# Patient Record
Sex: Female | Born: 1940 | Race: White | Hispanic: No | Marital: Married | Smoking: Never smoker
Health system: Southern US, Community
[De-identification: ages and names within clinical notes are randomized; demographics above are authoritative.]

## PROBLEM LIST (undated history)

## (undated) DIAGNOSIS — R42 Dizziness and giddiness: Secondary | ICD-10-CM

## (undated) DIAGNOSIS — M3500 Sicca syndrome, unspecified: Secondary | ICD-10-CM

## (undated) DIAGNOSIS — M130 Polyarthritis, unspecified: Secondary | ICD-10-CM

## (undated) DIAGNOSIS — N3281 Overactive bladder: Secondary | ICD-10-CM

## (undated) DIAGNOSIS — D472 Monoclonal gammopathy: Secondary | ICD-10-CM

## (undated) DIAGNOSIS — C8590 Non-Hodgkin lymphoma, unspecified, unspecified site: Secondary | ICD-10-CM

## (undated) HISTORY — PX: TUBAL LIGATION: SHX77

## (undated) HISTORY — DX: Polyarthritis, unspecified: M13.0

## (undated) HISTORY — DX: Dizziness and giddiness: R42

## (undated) HISTORY — PX: AUGMENTATION MAMMAPLASTY: SUR837

## (undated) HISTORY — DX: Non-Hodgkin lymphoma, unspecified, unspecified site: C85.90

## (undated) HISTORY — DX: Monoclonal gammopathy: D47.2

## (undated) HISTORY — PX: BREAST SURGERY: SHX581

## (undated) HISTORY — PX: BREAST BIOPSY: SHX20

## (undated) HISTORY — PX: MASTECTOMY: SHX3

## (undated) HISTORY — DX: Overactive bladder: N32.81

## (undated) HISTORY — DX: Sjogren syndrome, unspecified: M35.00

## (undated) HISTORY — PX: APPENDECTOMY: SHX54

---

## 1998-02-14 ENCOUNTER — Other Ambulatory Visit: Admission: RE | Admit: 1998-02-14 | Discharge: 1998-02-14 | Payer: Self-pay | Admitting: Obstetrics and Gynecology

## 1999-04-06 ENCOUNTER — Other Ambulatory Visit: Admission: RE | Admit: 1999-04-06 | Discharge: 1999-04-06 | Payer: Self-pay | Admitting: Obstetrics and Gynecology

## 1999-10-30 ENCOUNTER — Encounter: Payer: Self-pay | Admitting: Obstetrics and Gynecology

## 1999-10-30 ENCOUNTER — Encounter: Admission: RE | Admit: 1999-10-30 | Discharge: 1999-10-30 | Payer: Self-pay | Admitting: Obstetrics and Gynecology

## 2000-04-21 ENCOUNTER — Other Ambulatory Visit: Admission: RE | Admit: 2000-04-21 | Discharge: 2000-04-21 | Payer: Self-pay | Admitting: Obstetrics and Gynecology

## 2000-11-09 ENCOUNTER — Encounter: Admission: RE | Admit: 2000-11-09 | Discharge: 2000-11-09 | Payer: Self-pay | Admitting: Obstetrics and Gynecology

## 2000-11-09 ENCOUNTER — Encounter: Payer: Self-pay | Admitting: Obstetrics and Gynecology

## 2001-04-25 ENCOUNTER — Other Ambulatory Visit: Admission: RE | Admit: 2001-04-25 | Discharge: 2001-04-25 | Payer: Self-pay | Admitting: Obstetrics and Gynecology

## 2001-11-16 ENCOUNTER — Encounter: Admission: RE | Admit: 2001-11-16 | Discharge: 2001-11-16 | Payer: Self-pay | Admitting: Obstetrics and Gynecology

## 2001-11-16 ENCOUNTER — Encounter: Payer: Self-pay | Admitting: Obstetrics and Gynecology

## 2002-04-30 ENCOUNTER — Other Ambulatory Visit: Admission: RE | Admit: 2002-04-30 | Discharge: 2002-04-30 | Payer: Self-pay | Admitting: Obstetrics and Gynecology

## 2002-12-24 ENCOUNTER — Encounter: Payer: Self-pay | Admitting: Obstetrics and Gynecology

## 2002-12-24 ENCOUNTER — Ambulatory Visit (HOSPITAL_COMMUNITY): Admission: RE | Admit: 2002-12-24 | Discharge: 2002-12-24 | Payer: Self-pay | Admitting: Obstetrics and Gynecology

## 2003-05-23 ENCOUNTER — Other Ambulatory Visit: Admission: RE | Admit: 2003-05-23 | Discharge: 2003-05-23 | Payer: Self-pay | Admitting: Gynecology

## 2004-02-20 ENCOUNTER — Ambulatory Visit (HOSPITAL_COMMUNITY): Admission: RE | Admit: 2004-02-20 | Discharge: 2004-02-20 | Payer: Self-pay | Admitting: Obstetrics and Gynecology

## 2004-05-25 ENCOUNTER — Other Ambulatory Visit: Admission: RE | Admit: 2004-05-25 | Discharge: 2004-05-25 | Payer: Self-pay | Admitting: Obstetrics and Gynecology

## 2005-05-27 ENCOUNTER — Other Ambulatory Visit: Admission: RE | Admit: 2005-05-27 | Discharge: 2005-05-27 | Payer: Self-pay | Admitting: Obstetrics and Gynecology

## 2005-06-11 ENCOUNTER — Ambulatory Visit (HOSPITAL_COMMUNITY): Admission: RE | Admit: 2005-06-11 | Discharge: 2005-06-11 | Payer: Self-pay | Admitting: Obstetrics and Gynecology

## 2006-06-14 ENCOUNTER — Other Ambulatory Visit: Admission: RE | Admit: 2006-06-14 | Discharge: 2006-06-14 | Payer: Self-pay | Admitting: Obstetrics and Gynecology

## 2006-07-07 ENCOUNTER — Ambulatory Visit (HOSPITAL_COMMUNITY): Admission: RE | Admit: 2006-07-07 | Discharge: 2006-07-07 | Payer: Self-pay | Admitting: Obstetrics and Gynecology

## 2007-02-07 ENCOUNTER — Other Ambulatory Visit: Admission: RE | Admit: 2007-02-07 | Discharge: 2007-02-07 | Payer: Self-pay | Admitting: Obstetrics and Gynecology

## 2007-06-21 ENCOUNTER — Other Ambulatory Visit: Admission: RE | Admit: 2007-06-21 | Discharge: 2007-06-21 | Payer: Self-pay | Admitting: Obstetrics and Gynecology

## 2007-07-11 ENCOUNTER — Ambulatory Visit (HOSPITAL_COMMUNITY): Admission: RE | Admit: 2007-07-11 | Discharge: 2007-07-11 | Payer: Self-pay | Admitting: Obstetrics and Gynecology

## 2008-07-30 ENCOUNTER — Encounter: Payer: Self-pay | Admitting: Obstetrics and Gynecology

## 2008-07-30 ENCOUNTER — Ambulatory Visit: Payer: Self-pay | Admitting: Obstetrics and Gynecology

## 2008-07-30 ENCOUNTER — Other Ambulatory Visit: Admission: RE | Admit: 2008-07-30 | Discharge: 2008-07-30 | Payer: Self-pay | Admitting: Obstetrics and Gynecology

## 2008-08-28 ENCOUNTER — Ambulatory Visit (HOSPITAL_COMMUNITY): Admission: RE | Admit: 2008-08-28 | Discharge: 2008-08-28 | Payer: Self-pay | Admitting: Obstetrics and Gynecology

## 2009-07-31 ENCOUNTER — Ambulatory Visit: Payer: Self-pay | Admitting: Obstetrics and Gynecology

## 2009-09-19 ENCOUNTER — Ambulatory Visit (HOSPITAL_COMMUNITY): Admission: RE | Admit: 2009-09-19 | Discharge: 2009-09-19 | Payer: Self-pay | Admitting: Obstetrics and Gynecology

## 2009-10-18 ENCOUNTER — Encounter: Admission: RE | Admit: 2009-10-18 | Discharge: 2009-10-18 | Payer: Self-pay | Admitting: Otolaryngology

## 2010-09-02 ENCOUNTER — Ambulatory Visit: Admit: 2010-09-02 | Payer: Self-pay | Admitting: Obstetrics and Gynecology

## 2010-09-16 ENCOUNTER — Other Ambulatory Visit
Admission: RE | Admit: 2010-09-16 | Discharge: 2010-09-16 | Payer: Self-pay | Source: Home / Self Care | Admitting: Obstetrics and Gynecology

## 2010-09-16 ENCOUNTER — Ambulatory Visit
Admission: RE | Admit: 2010-09-16 | Discharge: 2010-09-16 | Payer: Self-pay | Source: Home / Self Care | Attending: Obstetrics and Gynecology | Admitting: Obstetrics and Gynecology

## 2010-09-16 ENCOUNTER — Other Ambulatory Visit: Payer: Self-pay | Admitting: Obstetrics and Gynecology

## 2010-09-21 ENCOUNTER — Ambulatory Visit (HOSPITAL_COMMUNITY)
Admission: RE | Admit: 2010-09-21 | Discharge: 2010-09-21 | Payer: Self-pay | Source: Home / Self Care | Attending: Obstetrics and Gynecology | Admitting: Obstetrics and Gynecology

## 2011-01-28 ENCOUNTER — Emergency Department (HOSPITAL_BASED_OUTPATIENT_CLINIC_OR_DEPARTMENT_OTHER)
Admission: EM | Admit: 2011-01-28 | Discharge: 2011-01-28 | Disposition: A | Discharge status: Other Acute Inpatient Hospital | Payer: Medicare Other | Attending: Emergency Medicine | Admitting: Emergency Medicine

## 2011-01-28 ENCOUNTER — Emergency Department (HOSPITAL_BASED_OUTPATIENT_CLINIC_OR_DEPARTMENT_OTHER): Payer: Medicare Other

## 2011-01-28 ENCOUNTER — Emergency Department (INDEPENDENT_AMBULATORY_CARE_PROVIDER_SITE_OTHER): Payer: Medicare Other

## 2011-01-28 DIAGNOSIS — S32509A Unspecified fracture of unspecified pubis, initial encounter for closed fracture: Secondary | ICD-10-CM | POA: Insufficient documentation

## 2011-01-28 DIAGNOSIS — Z8739 Personal history of other diseases of the musculoskeletal system and connective tissue: Secondary | ICD-10-CM | POA: Insufficient documentation

## 2011-01-28 DIAGNOSIS — M25559 Pain in unspecified hip: Secondary | ICD-10-CM

## 2011-01-28 DIAGNOSIS — Z79899 Other long term (current) drug therapy: Secondary | ICD-10-CM | POA: Insufficient documentation

## 2011-01-28 DIAGNOSIS — S32609A Unspecified fracture of unspecified ischium, initial encounter for closed fracture: Secondary | ICD-10-CM

## 2011-01-28 DIAGNOSIS — S322XXA Fracture of coccyx, initial encounter for closed fracture: Secondary | ICD-10-CM

## 2011-01-28 DIAGNOSIS — R42 Dizziness and giddiness: Secondary | ICD-10-CM | POA: Insufficient documentation

## 2011-01-28 DIAGNOSIS — W19XXXA Unspecified fall, initial encounter: Secondary | ICD-10-CM

## 2011-01-28 DIAGNOSIS — Y92009 Unspecified place in unspecified non-institutional (private) residence as the place of occurrence of the external cause: Secondary | ICD-10-CM | POA: Insufficient documentation

## 2011-01-28 DIAGNOSIS — Y93H2 Activity, gardening and landscaping: Secondary | ICD-10-CM | POA: Insufficient documentation

## 2011-01-28 DIAGNOSIS — S3210XA Unspecified fracture of sacrum, initial encounter for closed fracture: Secondary | ICD-10-CM | POA: Insufficient documentation

## 2011-01-28 DIAGNOSIS — W108XXA Fall (on) (from) other stairs and steps, initial encounter: Secondary | ICD-10-CM | POA: Insufficient documentation

## 2011-01-28 LAB — DIFFERENTIAL
Eosinophils Absolute: 0 10*3/uL (ref 0.0–0.7)
Lymphs Abs: 1.5 10*3/uL (ref 0.7–4.0)
Neutrophils Relative %: 80 % — ABNORMAL HIGH (ref 43–77)

## 2011-01-28 LAB — BASIC METABOLIC PANEL
CO2: 25 mEq/L (ref 19–32)
GFR calc non Af Amer: 55 mL/min — ABNORMAL LOW (ref >60)
Potassium: 4 mEq/L (ref 3.5–5.1)
Sodium: 136 mEq/L (ref 135–145)

## 2011-01-28 LAB — CBC
HCT: 33.4 % — ABNORMAL LOW (ref 36.0–46.0)
Hemoglobin: 11.2 g/dL — ABNORMAL LOW (ref 12.0–15.0)
MCH: 31 pg (ref 26.0–34.0)
MCHC: 33.5 g/dL (ref 30.0–36.0)
MCV: 92.5 fL (ref 78.0–100.0)
RBC: 3.61 MIL/uL — ABNORMAL LOW (ref 3.87–5.11)
RDW: 14.1 % (ref 11.5–15.5)
WBC: 13.2 10*3/uL — ABNORMAL HIGH (ref 4.0–10.5)

## 2011-09-09 ENCOUNTER — Other Ambulatory Visit: Payer: Self-pay | Admitting: Dermatology

## 2011-10-08 DIAGNOSIS — M130 Polyarthritis, unspecified: Secondary | ICD-10-CM | POA: Insufficient documentation

## 2011-10-08 DIAGNOSIS — M3500 Sicca syndrome, unspecified: Secondary | ICD-10-CM | POA: Insufficient documentation

## 2011-10-08 DIAGNOSIS — D472 Monoclonal gammopathy: Secondary | ICD-10-CM | POA: Insufficient documentation

## 2011-10-08 DIAGNOSIS — N3281 Overactive bladder: Secondary | ICD-10-CM | POA: Insufficient documentation

## 2011-10-13 ENCOUNTER — Other Ambulatory Visit: Payer: Self-pay | Admitting: Obstetrics and Gynecology

## 2011-10-13 DIAGNOSIS — Z1231 Encounter for screening mammogram for malignant neoplasm of breast: Secondary | ICD-10-CM

## 2011-10-18 ENCOUNTER — Ambulatory Visit (INDEPENDENT_AMBULATORY_CARE_PROVIDER_SITE_OTHER): Payer: Medicare Other | Admitting: Obstetrics and Gynecology

## 2011-10-18 ENCOUNTER — Encounter: Payer: Self-pay | Admitting: Obstetrics and Gynecology

## 2011-10-18 VITALS — BP 124/70 | Ht 63.5 in | Wt 109.0 lb

## 2011-10-18 DIAGNOSIS — M899 Disorder of bone, unspecified: Secondary | ICD-10-CM

## 2011-10-18 DIAGNOSIS — N760 Acute vaginitis: Secondary | ICD-10-CM

## 2011-10-18 DIAGNOSIS — M8580 Other specified disorders of bone density and structure, unspecified site: Secondary | ICD-10-CM

## 2011-10-18 DIAGNOSIS — R351 Nocturia: Secondary | ICD-10-CM

## 2011-10-18 DIAGNOSIS — R3915 Urgency of urination: Secondary | ICD-10-CM

## 2011-10-18 DIAGNOSIS — A499 Bacterial infection, unspecified: Secondary | ICD-10-CM

## 2011-10-18 DIAGNOSIS — N952 Postmenopausal atrophic vaginitis: Secondary | ICD-10-CM

## 2011-10-18 DIAGNOSIS — IMO0002 Reserved for concepts with insufficient information to code with codable children: Secondary | ICD-10-CM

## 2011-10-18 MED ORDER — FESOTERODINE FUMARATE ER 8 MG PO TB24: 8.0000 mg | ORAL_TABLET | Freq: Every day | ORAL | Status: DC

## 2011-10-18 NOTE — Progress Notes (Signed)
Patient came back to see me today for further followup. Since I last saw her she fell from a height and suffered 3 fractures in her pelvis. Only one was initially diagnosed. She actually had some bone displaced. There is no surgical correction for it. She is now doing physical therapy. In explaining the fall to me it clearly is a traumatic fracture. She takes calcium and vitamin D. She has osteopenia and is due for followup bone density. She is up-to-date on mammograms. She does have dyspareunia with vaginal dryness but feels she can tolerate it without vaginal estrogen. She is having no vaginal bleeding. She is having no pelvic pain. She is using oral refresh to keep her pH low in the vagina which has helped her with this vaginal discharge with odor. She also takes toviaz 8 mg with excellent results for urinary frequency and urgency. She is having no dysuria, hematuria, or incontinence.  ROS: 12 systems reviewed done. Pertinent positives above. Other positives include monoclonal gammopathy, sjogren"s disease and polyarthritis.  Physical examination:  Kennon Portela present. HEENT within normal limits. Neck: Thyroid not large. No masses. Supraclavicular nodes: not enlarged. Breasts: Examined in both sitting midline position. No skin changes and no masses. Abdomen: Soft no guarding rebound or masses or hernia. Pelvic: External: Within normal limits. BUS: Within normal limits. Vaginal:within normal limits. poor estrogen effect. No evidence of cystocele rectocele or enterocele. Cervix: clean. Uterus: Normal size and shape. Adnexa: No masses. Rectovaginal exam: Confirmatory and negative. Extremities: Within normal limits.  Assessment: #1. Persistent bacterial vaginosis #2. Osteopenia #3. Detrussor instability #4. Atrophic vaginitis  Plan: Continue Toviaz. Bone density ordered. Continue oral rephresh.

## 2011-11-04 ENCOUNTER — Ambulatory Visit (INDEPENDENT_AMBULATORY_CARE_PROVIDER_SITE_OTHER): Payer: Medicare Other

## 2011-11-04 DIAGNOSIS — M8580 Other specified disorders of bone density and structure, unspecified site: Secondary | ICD-10-CM

## 2011-11-04 DIAGNOSIS — M949 Disorder of cartilage, unspecified: Secondary | ICD-10-CM

## 2011-11-04 DIAGNOSIS — M899 Disorder of bone, unspecified: Secondary | ICD-10-CM

## 2011-11-05 ENCOUNTER — Ambulatory Visit (HOSPITAL_COMMUNITY)
Admission: RE | Admit: 2011-11-05 | Discharge: 2011-11-05 | Disposition: A | Discharge status: Home / Self Care | Payer: Medicare Other | Source: Ambulatory Visit | Attending: Obstetrics and Gynecology | Admitting: Obstetrics and Gynecology

## 2011-11-05 DIAGNOSIS — Z1231 Encounter for screening mammogram for malignant neoplasm of breast: Secondary | ICD-10-CM | POA: Insufficient documentation

## 2011-11-11 ENCOUNTER — Other Ambulatory Visit: Payer: Self-pay | Admitting: Dermatology

## 2012-02-01 ENCOUNTER — Other Ambulatory Visit: Payer: Self-pay | Admitting: Dermatology

## 2012-10-04 ENCOUNTER — Other Ambulatory Visit: Payer: Self-pay | Admitting: Obstetrics and Gynecology

## 2012-10-04 DIAGNOSIS — Z1231 Encounter for screening mammogram for malignant neoplasm of breast: Secondary | ICD-10-CM

## 2012-10-18 ENCOUNTER — Encounter: Payer: Self-pay | Admitting: Women's Health

## 2012-10-18 ENCOUNTER — Ambulatory Visit (INDEPENDENT_AMBULATORY_CARE_PROVIDER_SITE_OTHER): Payer: Medicare Other | Admitting: Women's Health

## 2012-10-18 VITALS — BP 116/74 | Ht 63.75 in | Wt 110.0 lb

## 2012-10-18 DIAGNOSIS — N3281 Overactive bladder: Secondary | ICD-10-CM

## 2012-10-18 DIAGNOSIS — N318 Other neuromuscular dysfunction of bladder: Secondary | ICD-10-CM

## 2012-10-18 MED ORDER — FESOTERODINE FUMARATE ER 8 MG PO TB24: 8.0000 mg | ORAL_TABLET | Freq: Every day | ORAL | Status: DC

## 2012-10-18 NOTE — Progress Notes (Signed)
Charlene Schroeder 1941-02-11 [id number]    History:    The patient presents for breast and pelvic exam.  Postmenopausal using over-the-counter profresh, which has helped prevent vaginal irritation/discharge. Osteopenia T score -1.6 at spine, -1.5 femeral neck FRAX 13%/1.8%  10/2011, Fosomax 01-09. Negative colonoscopy, due 2015. Bilateral mastectomies with silicone implants in the 80s for cysts, normal mammograms. Polyarthritis, Sjogren's, monoclonal gammopathy-Hematologist. Overactive bladder with good relief with Tovias. History of normal Paps. ASCUS 08 with -HR HPV.  Past medical history, past surgical history, family history and social history were all reviewed and documented in the EPIC chart. Father colon cancer, lymphoma, hypertension. History of a fractured  pelvis with traumatic fall. Two sons, 1 lives near [place], 1 lives here.   Exam:  Filed Vitals:   10/18/12 1124  BP: 116/74    General appearance:  Normal Head/Neck:  Normal, without cervical or supraclavicular adenopathy. Thyroid:  Symmetrical, normal in size, without palpable masses or nodularity. Respiratory  Effort:  Normal  Auscultation:  Clear without wheezing or rhonchi Cardiovascular  Auscultation:  Regular rate, without rubs, murmurs or gallops  Edema/varicosities:  Not grossly evident Abdominal  Soft,nontender, without masses, guarding or rebound.  Liver/spleen:  No organomegaly noted  Hernia:  None appreciated  Skin  Inspection:  Grossly normal  Palpation:  Grossly normal Neurologic/psychiatric  Orientation:  Normal with appropriate conversation.  Mood/affect:  Normal  Genitourinary    Breasts: Examined lying and sitting/silicone implants.     Right: Without masses, retractions, discharge or axillary adenopathy.     Left: Without masses, retractions, discharge or axillary adenopathy.   Inguinal/mons:  Normal without inguinal adenopathy  External genitalia:  Normal  BUS/Urethra/Skene's glands:   Normal  Bladder:  Normal  Vagina:  Normal  Cervix:  Normal  Uterus:   normal in size, shape and contour.  Midline and mobile  Adnexa/parametria:     Rt: Without masses or tenderness.   Lt: Without masses or tenderness.  Anus and perineum: Normal  Digital rectal exam: Normal sphincter tone without palpated masses or tenderness  Assessment/Plan:  72 y.o. MWF G2P2 for  Breast and pelvic exam.     Postmenopausal/no bleeding/no HRT using over-the-counter profresh Osteopenia T score -1.5 at left femoral hip 10/2011 Polyarthritis,Sjogren's-rheumatologist Monoclonal gammopathy hematologist Overactive bladder good relief with toviaz  Plan: SBE's, continue annual mammogram, calcium rich diet, vitamin D 2000 daily encouraged. Home safety, fall prevention and importance of exercise reviewed. Home Hemoccult card given with instructions. Toviaz 8 mg by mouth daily prescription, proper use given and reviewed. Paps normal 2012, new screening guidelines reviewed.    Harrington Challenger [facility], 11:57 AM 10/18/2012

## 2012-10-18 NOTE — Patient Instructions (Addendum)

## 2012-10-19 ENCOUNTER — Encounter: Payer: Self-pay | Admitting: Obstetrics and Gynecology

## 2012-10-30 ENCOUNTER — Other Ambulatory Visit: Payer: Self-pay | Admitting: Anesthesiology

## 2012-10-30 DIAGNOSIS — Z1211 Encounter for screening for malignant neoplasm of colon: Secondary | ICD-10-CM

## 2012-10-31 ENCOUNTER — Other Ambulatory Visit: Payer: Self-pay | Admitting: Gynecology

## 2012-10-31 DIAGNOSIS — Z1211 Encounter for screening for malignant neoplasm of colon: Secondary | ICD-10-CM

## 2012-11-06 ENCOUNTER — Ambulatory Visit (HOSPITAL_COMMUNITY): Payer: Medicare Other

## 2013-01-11 ENCOUNTER — Other Ambulatory Visit: Payer: Self-pay | Admitting: Dermatology

## 2013-06-27 ENCOUNTER — Other Ambulatory Visit: Payer: Self-pay | Admitting: Dermatology

## 2013-08-22 ENCOUNTER — Other Ambulatory Visit: Payer: Self-pay | Admitting: Women's Health

## 2013-08-22 DIAGNOSIS — Z1231 Encounter for screening mammogram for malignant neoplasm of breast: Secondary | ICD-10-CM

## 2013-08-31 ENCOUNTER — Other Ambulatory Visit: Payer: Self-pay | Admitting: Women's Health

## 2013-08-31 ENCOUNTER — Ambulatory Visit (HOSPITAL_COMMUNITY)
Admission: RE | Admit: 2013-08-31 | Discharge: 2013-08-31 | Disposition: A | Discharge status: Home / Self Care | Payer: Medicare Other | Source: Ambulatory Visit | Attending: Women's Health | Admitting: Women's Health

## 2013-08-31 DIAGNOSIS — Z1231 Encounter for screening mammogram for malignant neoplasm of breast: Secondary | ICD-10-CM

## 2013-12-10 ENCOUNTER — Encounter: Payer: Medicare Other | Admitting: Women's Health

## 2013-12-11 ENCOUNTER — Ambulatory Visit (INDEPENDENT_AMBULATORY_CARE_PROVIDER_SITE_OTHER): Payer: Medicare Other | Admitting: Women's Health

## 2013-12-11 ENCOUNTER — Encounter: Payer: Self-pay | Admitting: Women's Health

## 2013-12-11 ENCOUNTER — Telehealth: Payer: Self-pay | Admitting: *Deleted

## 2013-12-11 ENCOUNTER — Other Ambulatory Visit (HOSPITAL_COMMUNITY)
Admission: RE | Admit: 2013-12-11 | Discharge: 2013-12-11 | Disposition: A | Discharge status: Home / Self Care | Payer: Medicare Other | Source: Ambulatory Visit | Attending: Gynecology | Admitting: Gynecology

## 2013-12-11 ENCOUNTER — Other Ambulatory Visit: Payer: Self-pay | Admitting: Women's Health

## 2013-12-11 VITALS — BP 104/64 | Ht 63.5 in | Wt 108.0 lb

## 2013-12-11 DIAGNOSIS — M899 Disorder of bone, unspecified: Secondary | ICD-10-CM

## 2013-12-11 DIAGNOSIS — Z124 Encounter for screening for malignant neoplasm of cervix: Secondary | ICD-10-CM | POA: Insufficient documentation

## 2013-12-11 DIAGNOSIS — M8580 Other specified disorders of bone density and structure, unspecified site: Secondary | ICD-10-CM

## 2013-12-11 DIAGNOSIS — M949 Disorder of cartilage, unspecified: Secondary | ICD-10-CM

## 2013-12-11 DIAGNOSIS — N3281 Overactive bladder: Secondary | ICD-10-CM

## 2013-12-11 DIAGNOSIS — N318 Other neuromuscular dysfunction of bladder: Secondary | ICD-10-CM

## 2013-12-11 MED ORDER — FESOTERODINE FUMARATE ER 4 MG PO TB24: 4.0000 mg | ORAL_TABLET | Freq: Every day | ORAL | Status: DC

## 2013-12-11 MED ORDER — FESOTERODINE FUMARATE ER 8 MG PO TB24: 8.0000 mg | ORAL_TABLET | Freq: Every day | ORAL | Status: DC

## 2013-12-11 NOTE — Telephone Encounter (Signed)
Pharmacy called to clarify if patient should be on toviaz 4 mg or 8mg . Per note 12/11/13 pt will decrease to 4 mg. [facility] informed.

## 2013-12-11 NOTE — Addendum Note (Signed)
Addended by: Alen Blew on: 12/11/2013 04:17 PM   Modules accepted: Orders

## 2013-12-11 NOTE — Progress Notes (Signed)
Charlene Schroeder 1940-10-28 [id number]    History:    Presents for annual exam.  Postmenopausal using over-the-counter vaginal refresh, which has helped prevent vaginal irritation/discharge. 10/2011  T score -1.6 at spine, -1.5 femeral neck FRAX 13%/1.8%., Fosomax 2001-09. Negative colonoscopy, due this year. Bilateral mastectomies with silicone implants in the 80s for cysts, normal mammograms. Polyarthritis, Sjogren's, monoclonal gammopathy-Hematologist. Overactive bladder with good relief with Tovias. History of normal Paps with 2008 ASCUS  with -HR HPV.  Past medical history, past surgical history, family history and social history were all reviewed and documented in the EPIC chart. Father colon cancer, lymphoma, hypertension. History of a fractured pelvis with traumatic fall. Two sons, 1 lives near [place], 1 lives here.  ROS:  A  12 POINT ROS was performed and pertinent positives and negatives are included.  Exam:  Filed Vitals:   12/11/13 1151  BP: 104/64    General appearance:  Normal Thyroid:  Symmetrical, normal in size, without palpable masses or nodularity. Respiratory  Auscultation:  Clear without wheezing or rhonchi Cardiovascular  Auscultation:  Regular rate, without rubs, murmurs or gallops  Edema/varicosities:  Not grossly evident Abdominal  Soft,nontender, without masses, guarding or rebound.  Liver/spleen:  No organomegaly noted  Hernia:  None appreciated  Skin  Inspection:  Grossly normal   Breasts: Examined lying and sitting.     Right: Without masses, retractions, discharge or axillary adenopathy.     Left: Without masses, retractions, discharge or axillary adenopathy. Gentitourinary   Inguinal/mons:  Normal without inguinal adenopathy  External genitalia:  Normal  BUS/Urethra/Skene's glands:  Normal  Vagina:  atrophic  Cervix:  Normal  Uterus:   normal in size, shape and contour.  Midline and mobile  Adnexa/parametria:     Rt: Without masses or  tenderness.   Lt: Without masses or tenderness.  Anus and perineum: Normal  Digital rectal exam: Normal sphincter tone without palpated masses or tenderness  Assessment/Plan:  73 y.o. M WF G2P2 for annual exam.     Postmenopausal/no bleeding/no HRT/using over-the-counter Rephresh  Osteopenia fosamax holiday  Polyarthritis,Sjogren's-rheumatologist  Monoclonal gammopathy hematologist  Overactive bladder good relief with Lisbeth Ply  Plan: Toviaz, had been on 8 mg daily, will decrease dose to 4 mg, will call if problems with that change. Reviewed importance of adequate fluids. bladder. SBE's, continue annual mammogram, calcium rich diet, vitamin D 2000 daily encouraged. Bone density and colonoscopy scheduled. Home safety, fall prevention and importance of exercise reviewed. Pap. Continue to follow up with rheumatologist and hematologist.   [facility], 12:31 PM 12/11/2013

## 2013-12-11 NOTE — Patient Instructions (Signed)
Health Recommendations for Postmenopausal Women Respected and ongoing research has looked at the most common causes of death, disability, and poor quality of life in postmenopausal women. The causes include heart disease, diseases of blood vessels, diabetes, depression, cancer, and bone loss (osteoporosis). Many things can be done to help lower the chances of developing these and other common problems: CARDIOVASCULAR DISEASE Heart Disease: A heart attack is a medical emergency. Know the signs and symptoms of a heart attack. Below are things women can do to reduce their risk for heart disease.   Do not smoke. If you smoke, quit.  Aim for a healthy weight. Being overweight causes many preventable deaths. Eat a healthy and balanced diet and drink an adequate amount of liquids.  Get moving. Make a commitment to be more physically active. Aim for 30 minutes of activity on most, if not all days of the week.  Eat for heart health. Choose a diet that is low in saturated fat and cholesterol and eliminate trans fat. Include whole grains, vegetables, and fruits. Read and understand the labels on food containers before buying.  Know your numbers. Ask your caregiver to check your blood pressure, cholesterol (total, HDL, LDL, triglycerides) and blood glucose. Work with your caregiver on improving your entire clinical picture.  High blood pressure. Limit or stop your table salt intake (try salt substitute and food seasonings). Avoid salty foods and drinks. Read labels on food containers before buying. Eating well and exercising can help control high blood pressure. STROKE  Stroke is a medical emergency. Stroke may be the result of a blood clot in a blood vessel in the brain or by a brain hemorrhage (bleeding). Know the signs and symptoms of a stroke. To lower the risk of developing a stroke:  Avoid fatty foods.  Quit smoking.  Control your diabetes, blood pressure, and irregular heart rate. THROMBOPHLEBITIS  (BLOOD CLOT) OF THE LEG  Becoming overweight and leading a stationary lifestyle may also contribute to developing blood clots. Controlling your diet and exercising will help lower the risk of developing blood clots. CANCER SCREENING  Breast Cancer: Take steps to reduce your risk of breast cancer.  You should practice "breast self-awareness." This means understanding the normal appearance and feel of your breasts and should include breast self-examination. Any changes detected, no matter how small, should be reported to your caregiver.  After age 40, you should have a clinical breast exam (CBE) every year.  Starting at age 40, you should consider having a mammogram (breast X-ray) every year.  If you have a family history of breast cancer, talk to your caregiver about genetic screening.  If you are at high risk for breast cancer, talk to your caregiver about having an MRI and a mammogram every year.  Intestinal or Stomach Cancer: Tests to consider are a rectal exam, fecal occult blood, sigmoidoscopy, and colonoscopy. Women who are high risk may need to be screened at an earlier age and more often.  Cervical Cancer:  Beginning at age 30, you should have a Pap test every 3 years as long as the past 3 Pap tests have been normal.  If you have had past treatment for cervical cancer or a condition that could lead to cancer, you need Pap tests and screening for cancer for at least 20 years after your treatment.  If you had a hysterectomy for a problem that was not cancer or a condition that could lead to cancer, then you no longer need Pap tests.    If you are between ages 65 and 70, and you have had normal Pap tests going back 10 years, you no longer need Pap tests.  If Pap tests have been discontinued, risk factors (such as a new sexual partner) need to be reassessed to determine if screening should be resumed.  Some medical problems can increase the chance of getting cervical cancer. In these  cases, your caregiver may recommend more frequent screening and Pap tests.  Uterine Cancer: If you have vaginal bleeding after reaching menopause, you should notify your caregiver.  Ovarian cancer: Other than yearly pelvic exams, there are no reliable tests available to screen for ovarian cancer at this time except for yearly pelvic exams.  Lung Cancer: Yearly chest X-rays can detect lung cancer and should be done on high risk women, such as cigarette smokers and women with chronic lung disease (emphysema).  Skin Cancer: A complete body skin exam should be done at your yearly examination. Avoid overexposure to the sun and ultraviolet light lamps. Use a strong sun block cream when in the sun. All of these things are important in lowering the risk of skin cancer. MENOPAUSE Menopause Symptoms: Hormone therapy products are effective for treating symptoms associated with menopause:  Moderate to severe hot flashes.  Night sweats.  Mood swings.  Headaches.  Tiredness.  Loss of sex drive.  Insomnia.  Other symptoms. Hormone replacement carries certain risks, especially in older women. Women who use or are thinking about using estrogen or estrogen with progestin treatments should discuss that with their caregiver. Your caregiver will help you understand the benefits and risks. The ideal dose of hormone replacement therapy is not known. The Food and Drug Administration (FDA) has concluded that hormone therapy should be used only at the lowest doses and for the shortest amount of time to reach treatment goals.  OSTEOPOROSIS Protecting Against Bone Loss and Preventing Fracture: If you use hormone therapy for prevention of bone loss (osteoporosis), the risks for bone loss must outweigh the risk of the therapy. Ask your caregiver about other medications known to be safe and effective for preventing bone loss and fractures. To guard against bone loss or fractures, the following is recommended:  If  you are less than age 50, take 1000 mg of calcium and at least 600 mg of Vitamin D per day.  If you are greater than age 50 but less than age 70, take 1200 mg of calcium and at least 600 mg of Vitamin D per day.  If you are greater than age 70, take 1200 mg of calcium and at least 800 mg of Vitamin D per day. Smoking and excessive alcohol intake increases the risk of osteoporosis. Eat foods rich in calcium and vitamin D and do weight bearing exercises several times a week as your caregiver suggests. DIABETES Diabetes Melitus: If you have Type I or Type 2 diabetes, you should keep your blood sugar under control with diet, exercise and recommended medication. Avoid too many sweets, starchy and fatty foods. Being overweight can make control more difficult. COGNITION AND MEMORY Cognition and Memory: Menopausal hormone therapy is not recommended for the prevention of cognitive disorders such as Alzheimer's disease or memory loss.  DEPRESSION  Depression may occur at any age, but is common in elderly women. The reasons may be because of physical, medical, social (loneliness), or financial problems and needs. If you are experiencing depression because of medical problems and control of symptoms, talk to your caregiver about this. Physical activity and   exercise may help with mood and sleep. Community and volunteer involvement may help your sense of value and worth. If you have depression and you feel that the problem is getting worse or becoming severe, talk to your caregiver about treatment options that are best for you. ACCIDENTS  Accidents are common and can be serious in the elderly woman. Prepare your house to prevent accidents. Eliminate throw rugs, place hand bars in the bath, shower and toilet areas. Avoid wearing high heeled shoes or walking on wet, snowy, and icy areas. Limit or stop driving if you have vision or hearing problems, or you feel you are unsteady with you movements and  reflexes. HEPATITIS C Hepatitis C is a type of viral infection affecting the liver. It is spread mainly through contact with blood from an infected person. It can be treated, but if left untreated, it can lead to severe liver damage over years. Many people who are infected do not know that the virus is in their blood. If you are a "baby-boomer", it is recommended that you have one screening test for Hepatitis C. IMMUNIZATIONS  Several immunizations are important to consider having during your senior years, including:   Tetanus, diptheria, and pertussis booster shot.  Influenza every year before the flu season begins.  Pneumonia vaccine.  Shingles vaccine.  Others as indicated based on your specific needs. Talk to your caregiver about these. Document Released: 10/01/2005 Document Revised: 07/26/2012 Document Reviewed: 05/27/2008 [employer] Patient Information 2014 [employer].  

## 2013-12-19 ENCOUNTER — Other Ambulatory Visit: Payer: Self-pay | Admitting: Gynecology

## 2013-12-19 DIAGNOSIS — M8580 Other specified disorders of bone density and structure, unspecified site: Secondary | ICD-10-CM

## 2014-01-29 ENCOUNTER — Other Ambulatory Visit: Payer: Self-pay | Admitting: Dermatology

## 2014-04-17 ENCOUNTER — Telehealth: Payer: Self-pay | Admitting: *Deleted

## 2014-04-17 NOTE — Telephone Encounter (Signed)
Pt received letter stating insurance will no longer pay for toviaz, pt asked if another rx could be prescribed? Pt last seen on 12/11/13. Please advise

## 2014-04-17 NOTE — Telephone Encounter (Signed)
Message left

## 2014-04-18 ENCOUNTER — Other Ambulatory Visit: Payer: Self-pay | Admitting: Women's Health

## 2014-04-18 MED ORDER — TOLTERODINE TARTRATE ER 2 MG PO CP24: 2.0000 mg | ORAL_CAPSULE | Freq: Every day | ORAL | Status: DC

## 2014-04-18 NOTE — Telephone Encounter (Signed)
Telephone call, states Charlene Schroeder has worked great but not no longer covered by insurance. Would like to try something else, will try Detrol LA 2 mg, instructed to call if no relief of symptoms.

## 2014-05-13 ENCOUNTER — Telehealth: Payer: Self-pay | Admitting: *Deleted

## 2014-05-13 NOTE — Telephone Encounter (Signed)
Pt said the Detrol  LA 2 mg is not really helping her problem. Pt would like to have toviaz resubmitted to pharmacy so prior authorization could be ran. While this is being done pt will continue to take the detrol 2 mg until we fighure out of insurance will approve toviaz. Please advise

## 2014-05-13 NOTE — Telephone Encounter (Signed)
Could try the detrol 4mg , caution against dry mouth and constipation.

## 2014-05-14 MED ORDER — TOLTERODINE TARTRATE ER 4 MG PO CP24: 4.0000 mg | ORAL_CAPSULE | Freq: Every day | ORAL | Status: DC

## 2014-05-14 NOTE — Telephone Encounter (Signed)
Pt said she would like to try the below first she will follow up if the below is note working. Rx sent

## 2014-06-24 ENCOUNTER — Encounter: Payer: Self-pay | Admitting: Women's Health

## 2014-08-19 ENCOUNTER — Other Ambulatory Visit: Payer: Self-pay | Admitting: Women's Health

## 2014-08-19 DIAGNOSIS — Z1231 Encounter for screening mammogram for malignant neoplasm of breast: Secondary | ICD-10-CM

## 2014-10-02 ENCOUNTER — Ambulatory Visit (HOSPITAL_COMMUNITY)
Admission: RE | Admit: 2014-10-02 | Discharge: 2014-10-02 | Disposition: A | Discharge status: Home / Self Care | Payer: Medicare Other | Source: Ambulatory Visit | Attending: Women's Health | Admitting: Women's Health

## 2014-10-02 DIAGNOSIS — Z1231 Encounter for screening mammogram for malignant neoplasm of breast: Secondary | ICD-10-CM | POA: Diagnosis not present

## 2014-12-13 ENCOUNTER — Encounter: Payer: Self-pay | Admitting: Women's Health

## 2014-12-13 ENCOUNTER — Ambulatory Visit (INDEPENDENT_AMBULATORY_CARE_PROVIDER_SITE_OTHER): Payer: Medicare Other | Admitting: Women's Health

## 2014-12-13 VITALS — BP 128/80 | Ht 63.0 in | Wt 109.0 lb

## 2014-12-13 DIAGNOSIS — N3281 Overactive bladder: Secondary | ICD-10-CM

## 2014-12-13 DIAGNOSIS — N898 Other specified noninflammatory disorders of vagina: Secondary | ICD-10-CM | POA: Diagnosis not present

## 2014-12-13 LAB — WET PREP FOR TRICH, YEAST, CLUE
Clue Cells Wet Prep HPF POC: NONE SEEN
Trich, Wet Prep: NONE SEEN
Yeast Wet Prep HPF POC: NONE SEEN

## 2014-12-13 MED ORDER — OXYBUTYNIN CHLORIDE ER 5 MG PO TB24: 5.0000 mg | ORAL_TABLET | Freq: Every day | ORAL | Status: DC

## 2014-12-13 NOTE — Patient Instructions (Signed)
Overactive Bladder The bladder has two functions that are totally opposite of the other. One is to relax and stretch out so it can store urine (fills like a balloon), and the other is to contract and squeeze down so that it can empty the urine that it has stored. Proper functioning of the bladder is a complex mixing of these two functions. The filling and emptying of the bladder can be influenced by:  The bladder.  The spinal cord.  The brain.  The nerves going to the bladder.  Other organs that are closely related to the bladder such as prostate in males and the vagina in females. As your bladder fills with urine, nerve signals are sent from the bladder to the brain to tell you that you may need to urinate. Normal urination requires that the bladder squeeze down with sufficient strength to empty the bladder, but this also requires that the bladder squeeze down sufficiently long to finish the job. In addition the sphincter muscles, which normally keep you from leaking urine, must also relax so that the urine can pass. Coordination between the bladder muscle squeezing down and the sphincter muscles relaxing is required to make everything happen normally. With an overactive bladder sometimes the muscles of the bladder contract unexpectedly and involuntarily and this causes an urgent need to urinate. The normal response is to try to hold urine in by contracting the sphincter muscles. Sometimes the bladder contracts so strongly that the sphincter muscles cannot stop the urine from passing out and incontinence occurs. This kind of incontinence is called urge incontinence. Having an overactive bladder can be embarrassing and awkward. It can keep you from living life the way you want to. Many people think it is just something you have to put up with as you grow older or have certain health conditions. In fact, there are treatments that can help make your life easier and more pleasant. CAUSES  Many things  can cause an overactive bladder. Possibilities include:  Urinary tract infection or infection of nearby tissues such as the prostate.  Prostate enlargement.  In women, multiple pregnancies or surgery on the uterus or urethra.  Bladder stones, inflammation, or tumors.  Caffeine.  Alcohol.  Medications. For example, diuretics (drugs that help the body get rid of extra fluid) increase urine production. Some other medicines must be taken with lots of fluids.  Muscle or nerve weakness. This might be the result of a spinal cord injury, a stroke, multiple sclerosis, or Parkinson disease.  Diabetes can cause a high urine volume which fills the bladder so quickly that the normal urge to urinate is triggered very strongly. SYMPTOMS   Loss of bladder control. You feel the need to urinate and cannot make your body wait.  Sudden, strong urges to urinate.  Urinating 8 or more times a day.  Waking up to urinate two or more times a night. DIAGNOSIS  To decide if you have overactive bladder, your health care provider will probably:  Ask about symptoms you have noticed.  Ask about your overall health. This will include questions about any medications you are taking.  Do a physical examination. This will help determine if there are obvious blockages or other problems.  Order some tests. These might include:  A blood test to check for diabetes or other health issues that could be contributing to the problem.  Urine testing. This could measure the flow of urine and the pressure on the bladder.  A test of your neurological   system (the brain, spinal cord, and nerves). This is the system that senses the need to urinate. Some of these tests are called flow tests, bladder pressure tests, and electrical measurements of the sphincter muscle.  A bladder test to check whether it is emptying completely when you urinate.  Cystoscopy. This test uses a thin tube with a tiny camera on it. It offers a  look inside your urethra and bladder to see if there are problems.  Imaging tests. You might be given a contrast dye and then asked to urinate. X-rays are taken to see how your bladder is working. TREATMENT  An overactive bladder can be treated in many ways. The treatment will depend on the cause. Whether you have a mild or severe case also makes a difference. Often, treatment can be given in your health care provider's office or clinic. Be sure to discuss the different options with your caregiver. They include:  Behavioral treatments. These do not involve medication or surgery:  Bladder training. For this, you would follow a schedule to urinate at regular intervals. This helps you learn to control the urge to urinate. At first, you might be asked to wait a few minutes after feeling the urge. In time, you should be able to schedule bathroom visits an hour or more apart.  Kegel exercises. These exercises strengthen the pelvic floor muscles, which support the bladder. Toning these muscles can help control urination even if the bladder muscles are overactive. A specialist will teach you how to do these exercises correctly. They will require daily practice.  Weight loss. If you are obese or overweight, losing weight might stop your bladder from being overactive. Talk to your health care provider about how many pounds you should lose. Also ask if there is a specific program or method that would work best for you.  Diet change. This might be suggested if constipation is making your overactive bladder worse. Your health care provider or a nutritionist can explain ways to change what you eat to ease constipation. Other people might need to take in less caffeine or alcohol. Sometimes drinking fewer fluids is needed, too.  Protection. This is not an actual treatment. But, you could wear special pads to take care of any leakage while you wait for other treatments to take effect. This will help you avoid  embarrassment.  Physical treatments.  Electrical stimulation. Electrodes will send gentle pulses to the nerves or muscles that help control the bladder. The goal is to strengthen them. Sometimes this is done with the electrodes outside the body. Or, they might be placed inside the body (implanted). This treatment can take several months to have an effect.  Medications. These are usually used along with other treatments. Several medicines are available. Some are injected into the muscles involved in urination. Others come in pill form. Medications sometimes prescribed include:  Anticholinergics. These drugs block the signals that the nerves deliver to the bladder. This keeps it from releasing urine at the wrong time. Researchers think the drugs might help in other ways, too.  Imipramine. This is an antidepressant. But, it relaxes bladder muscles.  Botox. This is still experimental. Some people believe that injecting it into the bladder muscles will relax them so they work more normally. It has also been injected into the sphincter muscle when the sphincter muscle does not open properly. This is a temporary fix, however. Also, it might make matters worse, especially in older people.  Surgery.  A device might be implanted   to help manage your nerves. It works on the nerves that signal when you need to urinate.  Surgery is sometimes needed with electrical stimulation. If the electrodes are implanted, this is done through surgery.  Sometimes repairs need to be made through surgery. For example, the size of the bladder can be changed. This is usually done in severe cases only. HOME CARE INSTRUCTIONS   Take any medications your health care provider prescribed or suggested. Follow the directions carefully.  Practice any lifestyle changes that are recommended. These might include:  Drinking less fluid or drinking at different times of the day. If you need to urinate often during the night, for  example, you may need to stop drinking fluids early in the evening.  Cutting down on caffeine or alcohol. They can both make an overactive bladder worse. Caffeine is found in coffee, tea, and sodas.  Doing Kegel exercises to strengthen muscles.  Losing weight, if that is recommended.  Eating a healthy and balanced diet. This will help you avoid constipation.  Keep a journal or a log. You might be asked to record how much you drink and when, and also when you feel the need to urinate.  Learn how to care for implants or other devices, such as pessaries. SEEK MEDICAL CARE IF:   Your overactive bladder gets worse.  You feel increased pain or irritation when you urinate.  You notice blood in your urine.  You have questions about any medications or devices that your health care provider recommended.  You notice blood, pus, or swelling at the site of any test or treatment procedure.  You have an oral temperature above 102F (38.9C). SEEK IMMEDIATE MEDICAL CARE IF:  You have an oral temperature above 102F (38.9C), not controlled by medicine. Document Released: 06/05/2009 Document Revised: 12/24/2013 Document Reviewed: 06/05/2009 [facility] Patient Information 2015 [place], [place]. This information is not intended to replace advice given to you by your health care provider. Make sure you discuss any questions you have with your health care provider. Health Recommendations for Postmenopausal Women Respected and ongoing research has looked at the most common causes of death, disability, and poor quality of life in postmenopausal women. The causes include heart disease, diseases of blood vessels, diabetes, depression, cancer, and bone loss (osteoporosis). Many things can be done to help lower the chances of developing these and other common problems. CARDIOVASCULAR DISEASE Heart Disease: A heart attack is a medical emergency. Know the signs and symptoms of a heart attack. Below are things women  can do to reduce their risk for heart disease.   Do not smoke. If you smoke, quit.  Aim for a healthy weight. Being overweight causes many preventable deaths. Eat a healthy and balanced diet and drink an adequate amount of liquids.  Get moving. Make a commitment to be more physically active. Aim for 30 minutes of activity on most, if not all days of the week.  Eat for heart health. Choose a diet that is low in saturated fat and cholesterol and eliminate trans fat. Include whole grains, vegetables, and fruits. Read and understand the labels on food containers before buying.  Know your numbers. Ask your caregiver to check your blood pressure, cholesterol (total, HDL, LDL, triglycerides) and blood glucose. Work with your caregiver on improving your entire clinical picture.  High blood pressure. Limit or stop your table salt intake (try salt substitute and food seasonings). Avoid salty foods and drinks. Read labels on food containers before buying. Eating well and  exercising can help control high blood pressure. STROKE  Stroke is a medical emergency. Stroke may be the result of a blood clot in a blood vessel in the brain or by a brain hemorrhage (bleeding). Know the signs and symptoms of a stroke. To lower the risk of developing a stroke:  Avoid fatty foods.  Quit smoking.  Control your diabetes, blood pressure, and irregular heart rate. THROMBOPHLEBITIS (BLOOD CLOT) OF THE LEG  Becoming overweight and leading a stationary lifestyle may also contribute to developing blood clots. Controlling your diet and exercising will help lower the risk of developing blood clots. CANCER SCREENING  Breast Cancer: Take steps to reduce your risk of breast cancer.  You should practice "breast self-awareness." This means understanding the normal appearance and feel of your breasts and should include breast self-examination. Any changes detected, no matter how small, should be reported to your caregiver.  After  age 20, you should have a clinical breast exam (CBE) every year.  Starting at age 51, you should consider having a mammogram (breast X-ray) every year.  If you have a family history of breast cancer, talk to your caregiver about genetic screening.  If you are at high risk for breast cancer, talk to your caregiver about having an MRI and a mammogram every year.  Intestinal or Stomach Cancer: Tests to consider are a rectal exam, fecal occult blood, sigmoidoscopy, and colonoscopy. Women who are high risk may need to be screened at an earlier age and more often.  Cervical Cancer:  Beginning at age 15, you should have a Pap test every 3 years as long as the past 3 Pap tests have been normal.  If you have had past treatment for cervical cancer or a condition that could lead to cancer, you need Pap tests and screening for cancer for at least 20 years after your treatment.  If you had a hysterectomy for a problem that was not cancer or a condition that could lead to cancer, then you no longer need Pap tests.  If you are between ages 63 and 15, and you have had normal Pap tests going back 10 years, you no longer need Pap tests.  If Pap tests have been discontinued, risk factors (such as a new sexual partner) need to be reassessed to determine if screening should be resumed.  Some medical problems can increase the chance of getting cervical cancer. In these cases, your caregiver may recommend more frequent screening and Pap tests.  Uterine Cancer: If you have vaginal bleeding after reaching menopause, you should notify your caregiver.  Ovarian Cancer: Other than yearly pelvic exams, there are no reliable tests available to screen for ovarian cancer at this time except for yearly pelvic exams.  Lung Cancer: Yearly chest X-rays can detect lung cancer and should be done on high risk women, such as cigarette smokers and women with chronic lung disease (emphysema).  Skin Cancer: A complete body skin  exam should be done at your yearly examination. Avoid overexposure to the sun and ultraviolet light lamps. Use a strong sun block cream when in the sun. All of these things are important for lowering the risk of skin cancer. MENOPAUSE Menopause Symptoms: Hormone therapy products are effective for treating symptoms associated with menopause:  Moderate to severe hot flashes.  Night sweats.  Mood swings.  Headaches.  Tiredness.  Loss of sex drive.  Insomnia.  Other symptoms. Hormone replacement carries certain risks, especially in older women. Women who use or are thinking  about using estrogen or estrogen with progestin treatments should discuss that with their caregiver. Your caregiver will help you understand the benefits and risks. The ideal dose of hormone replacement therapy is not known. The Food and Drug Administration (FDA) has concluded that hormone therapy should be used only at the lowest doses and for the shortest amount of time to reach treatment goals.  OSTEOPOROSIS Protecting Against Bone Loss and Preventing Fracture If you use hormone therapy for prevention of bone loss (osteoporosis), the risks for bone loss must outweigh the risk of the therapy. Ask your caregiver about other medications known to be safe and effective for preventing bone loss and fractures. To guard against bone loss or fractures, the following is recommended:  If you are younger than age 37, take 1000 mg of calcium and at least 600 mg of Vitamin D per day.  If you are older than age 41 but younger than age 83, take 1200 mg of calcium and at least 600 mg of Vitamin D per day.  If you are older than age 86, take 1200 mg of calcium and at least 800 mg of Vitamin D per day. Smoking and excessive alcohol intake increases the risk of osteoporosis. Eat foods rich in calcium and vitamin D and do weight bearing exercises several times a week as your caregiver suggests. DIABETES Diabetes Mellitus: If you have  type I or type 2 diabetes, you should keep your blood sugar under control with diet, exercise, and recommended medication. Avoid starchy and fatty foods, and too many sweets. Being overweight can make diabetes control more difficult. COGNITION AND MEMORY Cognition and Memory: Menopausal hormone therapy is not recommended for the prevention of cognitive disorders such as Alzheimer's disease or memory loss.  DEPRESSION  Depression may occur at any age, but it is common in elderly women. This may be because of physical, medical, social (loneliness), or financial problems and needs. If you are experiencing depression because of medical problems and control of symptoms, talk to your caregiver about this. Physical activity and exercise may help with mood and sleep. Community and volunteer involvement may improve your sense of value and worth. If you have depression and you feel that the problem is getting worse or becoming severe, talk to your caregiver about which treatment options are best for you. ACCIDENTS  Accidents are common and can be serious in elderly woman. Prepare your house to prevent accidents. Eliminate throw rugs, place hand bars in bath, shower, and toilet areas. Avoid wearing high heeled shoes or walking on wet, snowy, and icy areas. Limit or stop driving if you have vision or hearing problems, or if you feel you are unsteady with your movements and reflexes. HEPATITIS C Hepatitis C is a type of viral infection affecting the liver. It is spread mainly through contact with blood from an infected person. It can be treated, but if left untreated, it can lead to severe liver damage over the years. Many people who are infected do not know that the virus is in their blood. If you are a "baby-boomer", it is recommended that you have one screening test for Hepatitis C. IMMUNIZATIONS  Several immunizations are important to consider having during your senior years, including:   Tetanus, diphtheria,  and pertussis booster shot.  Influenza every year before the flu season begins.  Pneumonia vaccine.  Shingles vaccine.  Others, as indicated based on your specific needs. Talk to your caregiver about these. Document Released: 10/01/2005 Document Revised: 12/24/2013 Document Reviewed:  05/27/2008 [employer] Patient Information 2015 [place], [place]. This information is not intended to replace advice given to you by your health care provider. Make sure you discuss any questions you have with your health care provider.

## 2014-12-13 NOTE — Progress Notes (Signed)
[place] 09-24-1940 [id number]    History:    Presents for discussion of overactive bladder and vaginal discharge.  Postmenopausal/no HRT/no bleeding. Normal Pap and mammogram history. 2014 DEXA in [place] primary care T score -2.1 at spine -2.1 at hip FRAX 14%/2%. Fosamax 2001-2009. Negative colonoscopy at 36. Current on vaccines. Bilateral implants for cystic breasts. Overactive bladder had been on Toviaz not covered by insurance and is now on Detrol 4 mg with only fair relief.  Past medical history, past surgical history, family history and social history were all reviewed and documented in the EPIC chart. Traveling to [place] this week. Father colon cancer, lymphoma, hypertension. History of polyarthritis and sjogrens, visual problems as a complication from medication.  ROS:  A ROS was performed and pertinent positives and negatives are included.  Exam:  Filed Vitals:   12/13/14 1205  BP: 128/80    General appearance:  Normal Thyroid:  Symmetrical, normal in size, without palpable masses or nodularity. Respiratory  Auscultation:  Clear without wheezing or rhonchi Cardiovascular  Auscultation:  Regular rate, without rubs, murmurs or gallops  Edema/varicosities:  Not grossly evident Abdominal  Soft,nontender, without masses, guarding or rebound.  Liver/spleen:  No organomegaly noted  Hernia:  None appreciated  Skin  Inspection:  Grossly normal   Breasts: Examined lying and sitting/lateral implants.     Right: Without masses, retractions, discharge or axillary adenopathy.     Left: Without masses, retractions, discharge or axillary adenopathy. Gentitourinary   Inguinal/mons:  Normal without inguinal adenopathy  External genitalia:  Normal  BUS/Urethra/Skene's glands:  Normal  Vagina:  Atrophic Cervix:  Normal  Uterus:   normal in size, shape and contour.  Midline and mobile  Adnexa/parametria:     Rt: Without masses or tenderness.   Lt: Without masses or  tenderness.  Anus and perineum: Normal  Digital rectal exam: Normal sphincter tone without palpated masses or tenderness  Assessment/Plan:  74 y.o. MWF G2 P2 for evaluation of overactive bladder and vaginal discharge.  Wet prep negative/atrophic vaginitis Osteopenia without elevated FRAX history of Fosamax Overactive bladder  Plan: Reviewed normality of wet prep, vaginal lubricants encouraged with intercourse. Continue refresh vaginal cream. Overactive bladder reviewed, will try Ditropan 5 mg, will call if no relief. Aware of dry mouth and constipation side effects. No history of glaucoma. SBE's, continue annual screening mammogram, calcium rich diet, vitamin D 1000 daily encouraged. Labs at primary care. Pap normal 2015, new screening guidelines reviewed.     Huel Cote [facility], 12:45 PM 12/13/2014

## 2014-12-13 NOTE — Addendum Note (Signed)
Addended by: Joaquin Music on: 12/13/2014 02:28 PM   Modules accepted: Orders

## 2015-12-04 ENCOUNTER — Other Ambulatory Visit: Payer: Self-pay

## 2015-12-04 DIAGNOSIS — Z1231 Encounter for screening mammogram for malignant neoplasm of breast: Secondary | ICD-10-CM

## 2015-12-16 ENCOUNTER — Encounter: Payer: Medicare Other | Admitting: Women's Health

## 2015-12-29 ENCOUNTER — Ambulatory Visit
Admission: RE | Admit: 2015-12-29 | Discharge: 2015-12-29 | Disposition: A | Discharge status: Home / Self Care | Payer: Medicare Other | Source: Ambulatory Visit

## 2015-12-29 DIAGNOSIS — Z1231 Encounter for screening mammogram for malignant neoplasm of breast: Secondary | ICD-10-CM

## 2016-02-03 ENCOUNTER — Encounter: Payer: Medicare Other | Admitting: Women's Health

## 2016-02-19 ENCOUNTER — Encounter: Payer: Self-pay | Admitting: Women's Health

## 2016-02-19 ENCOUNTER — Ambulatory Visit (INDEPENDENT_AMBULATORY_CARE_PROVIDER_SITE_OTHER): Payer: Medicare Other | Admitting: Women's Health

## 2016-02-19 VITALS — BP 126/80 | Ht 63.0 in | Wt 111.0 lb

## 2016-02-19 DIAGNOSIS — R32 Unspecified urinary incontinence: Secondary | ICD-10-CM | POA: Diagnosis not present

## 2016-02-19 DIAGNOSIS — Z01419 Encounter for gynecological examination (general) (routine) without abnormal findings: Secondary | ICD-10-CM

## 2016-02-19 NOTE — Progress Notes (Addendum)
[place] 10/21/1940 [id number]    History:    Presents for breast and pelvic exam. Postmenopausal with no bleeding on no HRT. Has had problems with urinary dribbling has been on Toviaz, Detrol and Ditropan has had side effects with most. Currently on medication. Has done better with less dribbling over the last 2 years. Recently had several days of dribbling. Normal Pap and mammogram history. Father colon cancer and hypertension, negative colonoscopy 2014. History of osteoporosis had been on Fosamax 2001-2009 last DEXA at primary care 2014 T score -2.1. Primary care manages labs and meds history of Sjogren's had problems from medication. Has had Pneumovax, unable to have Zostavax due to immunosuppression.  Past medical history, past surgical history, family history and social history were all reviewed and documented in the EPIC chart. Travels extensively. Regular exercise, works with a Clinical research associate, does yoga . 2 children both doing well.  ROS:  A ROS was performed and pertinent positives and negatives are included.  Exam:  Filed Vitals:   02/19/16 0853  BP: 126/80    General appearance:  Normal Thyroid:  Symmetrical, normal in size, without palpable masses or nodularity. Respiratory  Auscultation:  Clear without wheezing or rhonchi Cardiovascular  Auscultation:  Regular rate, without rubs, murmurs or gallops  Edema/varicosities:  Not grossly evident Abdominal  Soft,nontender, without masses, guarding or rebound.  Liver/spleen:  No organomegaly noted  Hernia:  None appreciated  Skin  Inspection:  Grossly normal   Breasts: Examined lying and sitting.     Right: Without masses, retractions, discharge or axillary adenopathy.     Left: Without masses, retractions, discharge or axillary adenopathy. Gentitourinary   Inguinal/mons:  Normal without inguinal adenopathy  External genitalia:  Normal  BUS/Urethra/Skene's glands:  Normal  Vagina:  Normal  Cervix:  Normal  Uterus:   normal  in size, shape and contour.  Midline and mobile  Adnexa/parametria:     Rt: Without masses or tenderness.   Lt: Without masses or tenderness.  Anus and perineum: Normal  Digital rectal exam: Normal sphincter tone without palpated masses or tenderness  Assessment/Plan:  75 y.o. MWF G2 P2 for breast and pelvic exam with occasional urinary dribbling.  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 Calcium rich diet, instructed to have vitamin D level checked at primary care. Safety, fall prevention and importance of weightbearing exercise reviewed. Follow-up with primary care for repeat DEXA. Options for urinary dribbling reviewed, will try avoiding caffeinated beverages, if dribbling increases or changes instructed to call for possible medication.  UA, Pap history normal reviewed new screening guidelines.   Huel Cote [facility], 10:11 AM 02/19/2016

## 2016-02-19 NOTE — Patient Instructions (Signed)

## 2016-08-25 ENCOUNTER — Encounter: Payer: Self-pay | Admitting: Women's Health

## 2016-08-25 ENCOUNTER — Ambulatory Visit (INDEPENDENT_AMBULATORY_CARE_PROVIDER_SITE_OTHER): Payer: Medicare Other | Admitting: Women's Health

## 2016-08-25 VITALS — BP 118/78 | Ht 63.0 in | Wt 111.0 lb

## 2016-08-25 DIAGNOSIS — R350 Frequency of micturition: Secondary | ICD-10-CM

## 2016-08-25 DIAGNOSIS — N393 Stress incontinence (female) (male): Secondary | ICD-10-CM

## 2016-08-25 LAB — URINALYSIS W MICROSCOPIC + REFLEX CULTURE
Bilirubin Urine: NEGATIVE
CASTS: NONE SEEN [LPF]
Crystals: NONE SEEN [HPF]
GLUCOSE, UA: NEGATIVE
Ketones, ur: NEGATIVE
LEUKOCYTES UA: NEGATIVE
Nitrite: NEGATIVE
Protein, ur: NEGATIVE
Specific Gravity, Urine: 1.01 (ref 1.001–1.035)
WBC, UA: NONE SEEN WBC/HPF (ref <=5)
Yeast: NONE SEEN [HPF]
pH: 6 (ref 5.0–8.0)

## 2016-08-25 MED ORDER — FESOTERODINE FUMARATE ER 4 MG PO TB24: 4.0000 mg | ORAL_TABLET | Freq: Every day | ORAL | 6 refills | Status: DC

## 2016-08-25 NOTE — Progress Notes (Signed)
Presents with complaint of increased urinary dribbling. Has had this problem for years but has increased over the past few months. Had been on Toviaz with good relief but  stopped and now would like to get back on it. Does not feel she has overactive bladder more just some stress and urge incontinence, some days being more problematic than others, does not saturate pads daily. Denies pain or burning with urination, vaginal discharge or fever Has had a difficult past 4 months with a fall causing a shoulder, back and rib fracture currently in physical therapy. Has follow-up scheduled with orthopedist, neurologist. Is currently in counseling for anxiety and depression and on Lexapro. History of rheumatoid arthritis. Postmenopausal on no HRT with no bleeding. Has low vision, denies glaucoma.  Exam: Appears well, thin. No CVAT. UA: Blood +1, negative leukocytes, no wbc's, 3-10 RBCs, rare bacteria  mixed urinary incontinence  Plan: Options reviewed, Toviaz 4 mg daily. Will call if no relief of symptoms. Instructed to keep scheduled follow-up. Reviewed may have some dry mouth, constipation, states did not have in the past.

## 2016-08-25 NOTE — Patient Instructions (Signed)
Urinary Incontinence Urinary incontinence is the involuntary loss of urine from your bladder. CAUSES  There are many causes of urinary incontinence. They include:  Medicines.  Infections.  Prostatic enlargement, leading to overflow of urine from your bladder.  Surgery.  Neurological diseases.  Emotional factors. SIGNS AND SYMPTOMS Urinary Incontinence can be divided into four types: 1. Urge incontinence. Urge incontinence is the involuntary loss of urine before you have the opportunity to go to the bathroom. There is a sudden urge to void but not enough time to reach a bathroom. 2. Stress incontinence. Stress incontinence is the sudden loss of urine with any activity that forces urine to pass. It is commonly caused by anatomical changes to the pelvis and sphincter areas of your body. 3. Overflow incontinence. Overflow incontinence is the loss of urine from an obstructed opening to your bladder. This results in a backup of urine and a resultant buildup of pressure within the bladder. When the pressure within the bladder exceeds the closing pressure of the sphincter, the urine overflows, which causes incontinence, similar to water overflowing a dam. 4. Total incontinence. Total incontinence is the loss of urine as a result of the inability to store urine within your bladder. DIAGNOSIS  Evaluating the cause of incontinence may require:  A thorough and complete medical and obstetric history.  A complete physical exam.  Laboratory tests such as a urine culture and sensitivities. When additional tests are indicated, they can include:  An ultrasound exam.  Kidney and bladder X-rays.  Cystoscopy. This is an exam of the bladder using a narrow scope.  Urodynamic testing to test the nerve function to the bladder and sphincter areas. TREATMENT  Treatment for urinary incontinence depends on the cause:  For urge incontinence caused by a bacterial infection, antibiotics will be prescribed.  If the urge incontinence is related to medicines you take, your health care provider may have you change the medicine.  For stress incontinence, surgery to re-establish anatomical support to the bladder or sphincter, or both, will often correct the condition.  For overflow incontinence caused by an enlarged prostate, an operation to open the channel through the enlarged prostate will allow the flow of urine out of the bladder. In women with fibroids, a hysterectomy may be recommended.  For total incontinence, surgery on your urinary sphincter may help. An artificial urinary sphincter (an inflatable cuff placed around the urethra) may be required. In women who have developed a hole-like passage between their bladder and vagina (vesicovaginal fistula), surgery to close the fistula often is required. HOME CARE INSTRUCTIONS  Normal daily hygiene and the use of pads or adult diapers that are changed regularly will help prevent odors and skin damage.  Avoid caffeine. It can overstimulate your bladder.  Use the bathroom regularly. Try about every 2-3 hours to go to the bathroom, even if you do not feel the need to do so. Take time to empty your bladder completely. After urinating, wait a minute. Then try to urinate again.  For causes involving nerve dysfunction, keep a log of the medicines you take and a journal of the times you go to the bathroom. SEEK MEDICAL CARE IF:  You experience worsening of pain instead of improvement in pain after your procedure.  Your incontinence becomes worse instead of better. SEE IMMEDIATE MEDICAL CARE IF:  You experience fever or shaking chills.  You are unable to pass your urine.  You have redness spreading into your groin or down into your thighs. MAKE SURE   YOU:  ?Understand these instructions.   Will watch your condition.  Will get help right away if you are not doing well or get worse. This information is not intended to replace advice given to you by  your health care provider. Make sure you discuss any questions you have with your health care provider. Document Released: 09/16/2004 Document Revised: 08/30/2014 Document Reviewed: 01/16/2013 Elsevier Interactive Patient Education  2017 [employer]  

## 2016-08-25 NOTE — Addendum Note (Signed)
Addended by: Burnett Kanaris on: 08/25/2016 04:31 PM   Modules accepted: Orders

## 2016-08-27 LAB — URINE CULTURE: Organism ID, Bacteria: NO GROWTH

## 2016-09-28 ENCOUNTER — Telehealth: Payer: Self-pay

## 2016-09-28 MED ORDER — SOLIFENACIN SUCCINATE 5 MG PO TABS: 5.0000 mg | ORAL_TABLET | Freq: Every day | ORAL | 4 refills | Status: DC

## 2016-09-28 NOTE — Telephone Encounter (Signed)
Rx sent 

## 2016-09-28 NOTE — Telephone Encounter (Signed)
Telephone call to patient states has not tried any other medications other than Toviaz. Will try Vesicare 5 mg by mouth daily and call if problems with the change.  Please E scribe Vesicare 5 mg daily.

## 2016-09-28 NOTE — Telephone Encounter (Signed)
Note pharmacy sent said that "Toviaz not covered. Oybutynin ER, Myrbetriq, Vesicare are preferred choices.   Please send Rx with strength, directions, quantity and refills".

## 2016-11-22 ENCOUNTER — Other Ambulatory Visit: Payer: Self-pay | Admitting: Family Medicine

## 2016-11-22 DIAGNOSIS — Z1231 Encounter for screening mammogram for malignant neoplasm of breast: Secondary | ICD-10-CM

## 2017-01-05 ENCOUNTER — Encounter: Payer: Self-pay | Admitting: Gynecology

## 2017-02-22 ENCOUNTER — Ambulatory Visit
Admission: RE | Admit: 2017-02-22 | Discharge: 2017-02-22 | Disposition: A | Discharge status: Home / Self Care | Payer: Medicare Other | Source: Ambulatory Visit | Attending: Family Medicine | Admitting: Family Medicine

## 2017-02-22 ENCOUNTER — Other Ambulatory Visit: Payer: Self-pay | Admitting: Family Medicine

## 2017-02-22 ENCOUNTER — Encounter: Payer: Self-pay | Admitting: Women's Health

## 2017-02-22 ENCOUNTER — Ambulatory Visit (INDEPENDENT_AMBULATORY_CARE_PROVIDER_SITE_OTHER): Payer: Medicare Other | Admitting: Women's Health

## 2017-02-22 VITALS — BP 118/78 | Ht 63.0 in | Wt 111.0 lb

## 2017-02-22 DIAGNOSIS — Z01419 Encounter for gynecological examination (general) (routine) without abnormal findings: Secondary | ICD-10-CM | POA: Diagnosis not present

## 2017-02-22 DIAGNOSIS — Z1231 Encounter for screening mammogram for malignant neoplasm of breast: Secondary | ICD-10-CM

## 2017-02-22 NOTE — Progress Notes (Signed)
Charlene Schroeder [birth year] [id number]    History:    Presents for breast and pelvic exam. Postmenopausal on no HRT with no bleeding. Normal Pap and mammogram history. 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 History of Sjogren's and polyarthritis. Has had some anxiety and depression has seen a Social worker. Has had Pneumovax. Primary care managing DEXA. Took Fosamax for 6 years. Was able to travel to [place] this past year, but states was a difficult year health wise. Unable to do yoga as she once did.  Past medical history, past surgical history, family history and social history were all reviewed and documented in the EPIC chart. Has 2 sons, 1 lives in [place] good relationship, other son lives local poor relationship due to his wife. Has 2 granddaughters.  ROS:  A ROS was performed and pertinent positives and negatives are included.  Exam:  Vitals:   02/22/17 0903  BP: 118/78  Weight: 111 lb (50.3 kg)  Height: 5\' 3"  (1.6 m)   Body mass index is 19.66 kg/m.   General appearance:  Normal Thyroid:  Symmetrical, normal in size, without palpable masses or nodularity. Respiratory  Auscultation:  Clear without wheezing or rhonchi Cardiovascular  Auscultation:  Regular rate, without rubs, murmurs or gallops  Edema/varicosities:  Not grossly evident Abdominal  Soft,nontender, without masses, guarding or rebound.  Liver/spleen:  No organomegaly noted  Hernia:  None appreciated  Skin  Inspection:  Grossly normal   Breasts: Examined lying and sitting.     Right: Without masses, retractions, discharge or axillary adenopathy.     Left: Without masses, retractions, discharge or axillary adenopathy. Gentitourinary   Inguinal/mons:  Normal without inguinal adenopathy  External genitalia:  Normal  BUS/Urethra/Skene's glands:  Normal  Vagina:  Atrophic  Cervix:   Normal  Uterus:   normal in size, shape and contour.  Midline and mobile  Adnexa/parametria:     Rt: Without masses or tenderness.   Lt: Without masses or tenderness.  Anus and perineum: Normal  Digital rectal exam: Normal sphincter tone without palpated masses or tenderness  Assessment/Plan:  76 y.o. MWF G2 2 P2 for breast and pelvic exam.  Postmenopausal/no HRT/no bleeding Lymphoma-oncologist managing Left humerus fracture, spine fracture 2017 Arthritis, Sjogren's - Primary care manages labs, DEXA and meds  Plan: SBE's, continue annual screening mammogram has one scheduled. Exercise as able, calcium rich diet, vitamin D 2000 daily encouraged. Home safety, fall prevention and importance of weightbearing exercise reviewed. Pap screening guidelines reviewed.  [place], 9:57 AM 02/22/2017

## 2017-02-22 NOTE — Patient Instructions (Signed)
Health Maintenance for Postmenopausal Women Menopause is a normal process in which your reproductive ability comes to an end. This process happens gradually over a span of months to years, usually between the ages of 22 and 9. Menopause is complete when you have missed 12 consecutive menstrual periods. It is important to talk with your health care provider about some of the most common conditions that affect postmenopausal women, such as heart disease, cancer, and bone loss (osteoporosis). Adopting a healthy lifestyle and getting preventive care can help to promote your health and wellness. Those actions can also lower your chances of developing some of these common conditions. What should I know about menopause? During menopause, you may experience a number of symptoms, such as:  Moderate-to-severe hot flashes.  Night sweats.  Decrease in sex drive.  Mood swings.  Headaches.  Tiredness.  Irritability.  Memory problems.  Insomnia.  Choosing to treat or not to treat menopausal changes is an individual decision that you make with your health care provider. What should I know about hormone replacement therapy and supplements? Hormone therapy products are effective for treating symptoms that are associated with menopause, such as hot flashes and night sweats. Hormone replacement carries certain risks, especially as you become older. If you are thinking about using estrogen or estrogen with progestin treatments, discuss the benefits and risks with your health care provider. What should I know about heart disease and stroke? Heart disease, heart attack, and stroke become more likely as you age. This may be due, in part, to the hormonal changes that your body experiences during menopause. These can affect how your body processes dietary fats, triglycerides, and cholesterol. Heart attack and stroke are both medical emergencies. There are many things that you can do to help prevent heart disease  and stroke:  Have your blood pressure checked at least every 1-2 years. High blood pressure causes heart disease and increases the risk of stroke.  If you are 53-22 years old, ask your health care provider if you should take aspirin to prevent a heart attack or a stroke.  Do not use any tobacco products, including cigarettes, chewing tobacco, or electronic cigarettes. If you need help quitting, ask your health care provider.  It is important to eat a healthy diet and maintain a healthy weight. ? Be sure to include plenty of vegetables, fruits, low-fat dairy products, and lean protein. ? Avoid eating foods that are high in solid fats, added sugars, or salt (sodium).  Get regular exercise. This is one of the most important things that you can do for your health. ? Try to exercise for at least 150 minutes each week. The type of exercise that you do should increase your heart rate and make you sweat. This is known as moderate-intensity exercise. ? Try to do strengthening exercises at least twice each week. Do these in addition to the moderate-intensity exercise.  Know your numbers.Ask your health care provider to check your cholesterol and your blood glucose. Continue to have your blood tested as directed by your health care provider.  What should I know about cancer screening? There are several types of cancer. Take the following steps to reduce your risk and to catch any cancer development as early as possible. Breast Cancer  Practice breast self-awareness. ? This means understanding how your breasts normally appear and feel. ? It also means doing regular breast self-exams. Let your health care provider know about any changes, no matter how small.  If you are 40  or older, have a clinician do a breast exam (clinical breast exam or CBE) every year. Depending on your age, family history, and medical history, it may be recommended that you also have a yearly breast X-ray (mammogram).  If you  have a family history of breast cancer, talk with your health care provider about genetic screening.  If you are at high risk for breast cancer, talk with your health care provider about having an MRI and a mammogram every year.  Breast cancer (BRCA) gene test is recommended for women who have family members with BRCA-related cancers. Results of the assessment will determine the need for genetic counseling and BRCA1 and for BRCA2 testing. BRCA-related cancers include these types: ? Breast. This occurs in males or females. ? Ovarian. ? Tubal. This may also be called fallopian tube cancer. ? Cancer of the abdominal or pelvic lining (peritoneal cancer). ? Prostate. ? Pancreatic.  Cervical, Uterine, and Ovarian Cancer Your health care provider may recommend that you be screened regularly for cancer of the pelvic organs. These include your ovaries, uterus, and vagina. This screening involves a pelvic exam, which includes checking for microscopic changes to the surface of your cervix (Pap test).  For women ages 21-65, health care providers may recommend a pelvic exam and a Pap test every three years. For women ages 79-65, they may recommend the Pap test and pelvic exam, combined with testing for human papilloma virus (HPV), every five years. Some types of HPV increase your risk of cervical cancer. Testing for HPV may also be done on women of any age who have unclear Pap test results.  Other health care providers may not recommend any screening for nonpregnant women who are considered low risk for pelvic cancer and have no symptoms. Ask your health care provider if a screening pelvic exam is right for you.  If you have had past treatment for cervical cancer or a condition that could lead to cancer, you need Pap tests and screening for cancer for at least 20 years after your treatment. If Pap tests have been discontinued for you, your risk factors (such as having a new sexual partner) need to be  reassessed to determine if you should start having screenings again. Some women have medical problems that increase the chance of getting cervical cancer. In these cases, your health care provider may recommend that you have screening and Pap tests more often.  If you have a family history of uterine cancer or ovarian cancer, talk with your health care provider about genetic screening.  If you have vaginal bleeding after reaching menopause, tell your health care provider.  There are currently no reliable tests available to screen for ovarian cancer.  Lung Cancer Lung cancer screening is recommended for adults 69-62 years old who are at high risk for lung cancer because of a history of smoking. A yearly low-dose CT scan of the lungs is recommended if you:  Currently smoke.  Have a history of at least 30 pack-years of smoking and you currently smoke or have quit within the past 15 years. A pack-year is smoking an average of one pack of cigarettes per day for one year.  Yearly screening should:  Continue until it has been 15 years since you quit.  Stop if you develop a health problem that would prevent you from having lung cancer treatment.  Colorectal Cancer  This type of cancer can be detected and can often be prevented.  Routine colorectal cancer screening usually begins at  age 42 and continues through age 45.  If you have risk factors for colon cancer, your health care provider may recommend that you be screened at an earlier age.  If you have a family history of colorectal cancer, talk with your health care provider about genetic screening.  Your health care provider may also recommend using home test kits to check for hidden blood in your stool.  A small camera at the end of a tube can be used to examine your colon directly (sigmoidoscopy or colonoscopy). This is done to check for the earliest forms of colorectal cancer.  Direct examination of the colon should be repeated every  5-10 years until age 71. However, if early forms of precancerous polyps or small growths are found or if you have a family history or genetic risk for colorectal cancer, you may need to be screened more often.  Skin Cancer  Check your skin from head to toe regularly.  Monitor any moles. Be sure to tell your health care provider: ? About any new moles or changes in moles, especially if there is a change in a mole's shape or color. ? If you have a mole that is larger than the size of a pencil eraser.  If any of your family members has a history of skin cancer, especially at a young age, talk with your health care provider about genetic screening.  Always use sunscreen. Apply sunscreen liberally and repeatedly throughout the day.  Whenever you are outside, protect yourself by wearing long sleeves, pants, a wide-brimmed hat, and sunglasses.  What should I know about osteoporosis? Osteoporosis is a condition in which bone destruction happens more quickly than new bone creation. After menopause, you may be at an increased risk for osteoporosis. To help prevent osteoporosis or the bone fractures that can happen because of osteoporosis, the following is recommended:  If you are 46-71 years old, get at least 1,000 mg of calcium and at least 600 mg of vitamin D per day.  If you are older than age 55 but younger than age 65, get at least 1,200 mg of calcium and at least 600 mg of vitamin D per day.  If you are older than age 54, get at least 1,200 mg of calcium and at least 800 mg of vitamin D per day.  Smoking and excessive alcohol intake increase the risk of osteoporosis. Eat foods that are rich in calcium and vitamin D, and do weight-bearing exercises several times each week as directed by your health care provider. What should I know about how menopause affects my mental health? Depression may occur at any age, but it is more common as you become older. Common symptoms of depression  include:  Low or sad mood.  Changes in sleep patterns.  Changes in appetite or eating patterns.  Feeling an overall lack of motivation or enjoyment of activities that you previously enjoyed.  Frequent crying spells.  Talk with your health care provider if you think that you are experiencing depression. What should I know about immunizations? It is important that you get and maintain your immunizations. These include:  Tetanus, diphtheria, and pertussis (Tdap) booster vaccine.  Influenza every year before the flu season begins.  Pneumonia vaccine.  Shingles vaccine.  Your health care provider may also recommend other immunizations. This information is not intended to replace advice given to you by your health care provider. Make sure you discuss any questions you have with your health care provider. Document Released: 10/01/2005  Document Revised: 02/27/2016 Document Reviewed: 05/13/2015 Elsevier Interactive Patient Education  2018 [employer]  

## 2017-02-23 LAB — URINALYSIS W MICROSCOPIC + REFLEX CULTURE
BACTERIA UA: NONE SEEN [HPF]
Bilirubin Urine: NEGATIVE
CASTS: NONE SEEN [LPF]
CRYSTALS: NONE SEEN [HPF]
Glucose, UA: NEGATIVE
Hgb urine dipstick: NEGATIVE
KETONES UR: NEGATIVE
Nitrite: NEGATIVE
PROTEIN: NEGATIVE
RBC / HPF: NONE SEEN RBC/HPF (ref <=2)
SPECIFIC GRAVITY, URINE: 1.016 (ref 1.001–1.035)
Yeast: NONE SEEN [HPF]
pH: 5.5 (ref 5.0–8.0)

## 2017-02-25 ENCOUNTER — Other Ambulatory Visit: Payer: Self-pay | Admitting: *Deleted

## 2017-02-25 LAB — URINE CULTURE

## 2017-02-25 MED ORDER — AMPICILLIN 500 MG PO CAPS: 500.0000 mg | ORAL_CAPSULE | Freq: Three times a day (TID) | ORAL | 0 refills | Status: AC

## 2017-04-06 ENCOUNTER — Ambulatory Visit (INDEPENDENT_AMBULATORY_CARE_PROVIDER_SITE_OTHER): Payer: 59 | Admitting: Licensed Clinical Social Worker

## 2017-04-06 DIAGNOSIS — F331 Major depressive disorder, recurrent, moderate: Secondary | ICD-10-CM | POA: Diagnosis not present

## 2017-04-14 ENCOUNTER — Ambulatory Visit: Payer: 59 | Admitting: Licensed Clinical Social Worker

## 2017-04-18 ENCOUNTER — Ambulatory Visit (INDEPENDENT_AMBULATORY_CARE_PROVIDER_SITE_OTHER): Payer: 59 | Admitting: Licensed Clinical Social Worker

## 2017-04-18 DIAGNOSIS — F3341 Major depressive disorder, recurrent, in partial remission: Secondary | ICD-10-CM

## 2017-05-16 ENCOUNTER — Ambulatory Visit: Payer: 59 | Admitting: Licensed Clinical Social Worker

## 2017-05-30 ENCOUNTER — Ambulatory Visit (INDEPENDENT_AMBULATORY_CARE_PROVIDER_SITE_OTHER): Payer: Medicare Other | Admitting: Licensed Clinical Social Worker

## 2017-05-30 DIAGNOSIS — F3341 Major depressive disorder, recurrent, in partial remission: Secondary | ICD-10-CM | POA: Diagnosis not present

## 2017-09-08 ENCOUNTER — Ambulatory Visit (INDEPENDENT_AMBULATORY_CARE_PROVIDER_SITE_OTHER): Payer: Medicare Other | Admitting: Licensed Clinical Social Worker

## 2017-09-08 DIAGNOSIS — F3341 Major depressive disorder, recurrent, in partial remission: Secondary | ICD-10-CM

## 2017-10-17 ENCOUNTER — Ambulatory Visit: Payer: Medicare Other | Admitting: Licensed Clinical Social Worker

## 2017-12-28 ENCOUNTER — Other Ambulatory Visit: Payer: Self-pay | Admitting: Women's Health

## 2018-01-20 ENCOUNTER — Ambulatory Visit (INDEPENDENT_AMBULATORY_CARE_PROVIDER_SITE_OTHER): Payer: Medicare Other | Admitting: Licensed Clinical Social Worker

## 2018-01-20 DIAGNOSIS — F3341 Major depressive disorder, recurrent, in partial remission: Secondary | ICD-10-CM

## 2018-01-25 ENCOUNTER — Other Ambulatory Visit: Payer: Self-pay | Admitting: Women's Health

## 2018-01-25 ENCOUNTER — Other Ambulatory Visit: Payer: Self-pay | Admitting: Family Medicine

## 2018-01-25 DIAGNOSIS — Z1231 Encounter for screening mammogram for malignant neoplasm of breast: Secondary | ICD-10-CM

## 2018-02-24 ENCOUNTER — Ambulatory Visit: Payer: Self-pay

## 2018-02-27 ENCOUNTER — Ambulatory Visit: Payer: Medicare Other | Admitting: Women's Health

## 2018-02-27 ENCOUNTER — Encounter: Payer: Self-pay | Admitting: Women's Health

## 2018-02-27 VITALS — BP 126/78 | Ht 63.0 in | Wt 107.0 lb

## 2018-02-27 DIAGNOSIS — Z01419 Encounter for gynecological examination (general) (routine) without abnormal findings: Secondary | ICD-10-CM

## 2018-02-27 NOTE — Patient Instructions (Signed)
Health Maintenance for Postmenopausal Women Menopause is a normal process in which your reproductive ability comes to an end. This process happens gradually over a span of months to years, usually between the ages of 22 and 9. Menopause is complete when you have missed 12 consecutive menstrual periods. It is important to talk with your health care provider about some of the most common conditions that affect postmenopausal women, such as heart disease, cancer, and bone loss (osteoporosis). Adopting a healthy lifestyle and getting preventive care can help to promote your health and wellness. Those actions can also lower your chances of developing some of these common conditions. What should I know about menopause? During menopause, you may experience a number of symptoms, such as:  Moderate-to-severe hot flashes.  Night sweats.  Decrease in sex drive.  Mood swings.  Headaches.  Tiredness.  Irritability.  Memory problems.  Insomnia.  Choosing to treat or not to treat menopausal changes is an individual decision that you make with your health care provider. What should I know about hormone replacement therapy and supplements? Hormone therapy products are effective for treating symptoms that are associated with menopause, such as hot flashes and night sweats. Hormone replacement carries certain risks, especially as you become older. If you are thinking about using estrogen or estrogen with progestin treatments, discuss the benefits and risks with your health care provider. What should I know about heart disease and stroke? Heart disease, heart attack, and stroke become more likely as you age. This may be due, in part, to the hormonal changes that your body experiences during menopause. These can affect how your body processes dietary fats, triglycerides, and cholesterol. Heart attack and stroke are both medical emergencies. There are many things that you can do to help prevent heart disease  and stroke:  Have your blood pressure checked at least every 1-2 years. High blood pressure causes heart disease and increases the risk of stroke.  If you are 53-22 years old, ask your health care provider if you should take aspirin to prevent a heart attack or a stroke.  Do not use any tobacco products, including cigarettes, chewing tobacco, or electronic cigarettes. If you need help quitting, ask your health care provider.  It is important to eat a healthy diet and maintain a healthy weight. ? Be sure to include plenty of vegetables, fruits, low-fat dairy products, and lean protein. ? Avoid eating foods that are high in solid fats, added sugars, or salt (sodium).  Get regular exercise. This is one of the most important things that you can do for your health. ? Try to exercise for at least 150 minutes each week. The type of exercise that you do should increase your heart rate and make you sweat. This is known as moderate-intensity exercise. ? Try to do strengthening exercises at least twice each week. Do these in addition to the moderate-intensity exercise.  Know your numbers.Ask your health care provider to check your cholesterol and your blood glucose. Continue to have your blood tested as directed by your health care provider.  What should I know about cancer screening? There are several types of cancer. Take the following steps to reduce your risk and to catch any cancer development as early as possible. Breast Cancer  Practice breast self-awareness. ? This means understanding how your breasts normally appear and feel. ? It also means doing regular breast self-exams. Let your health care provider know about any changes, no matter how small.  If you are 40  or older, have a clinician do a breast exam (clinical breast exam or CBE) every year. Depending on your age, family history, and medical history, it may be recommended that you also have a yearly breast X-ray (mammogram).  If you  have a family history of breast cancer, talk with your health care provider about genetic screening.  If you are at high risk for breast cancer, talk with your health care provider about having an MRI and a mammogram every year.  Breast cancer (BRCA) gene test is recommended for women who have family members with BRCA-related cancers. Results of the assessment will determine the need for genetic counseling and BRCA1 and for BRCA2 testing. BRCA-related cancers include these types: ? Breast. This occurs in males or females. ? Ovarian. ? Tubal. This may also be called fallopian tube cancer. ? Cancer of the abdominal or pelvic lining (peritoneal cancer). ? Prostate. ? Pancreatic.  Cervical, Uterine, and Ovarian Cancer Your health care provider may recommend that you be screened regularly for cancer of the pelvic organs. These include your ovaries, uterus, and vagina. This screening involves a pelvic exam, which includes checking for microscopic changes to the surface of your cervix (Pap test).  For women ages 21-65, health care providers may recommend a pelvic exam and a Pap test every three years. For women ages 79-65, they may recommend the Pap test and pelvic exam, combined with testing for human papilloma virus (HPV), every five years. Some types of HPV increase your risk of cervical cancer. Testing for HPV may also be done on women of any age who have unclear Pap test results.  Other health care providers may not recommend any screening for nonpregnant women who are considered low risk for pelvic cancer and have no symptoms. Ask your health care provider if a screening pelvic exam is right for you.  If you have had past treatment for cervical cancer or a condition that could lead to cancer, you need Pap tests and screening for cancer for at least 20 years after your treatment. If Pap tests have been discontinued for you, your risk factors (such as having a new sexual partner) need to be  reassessed to determine if you should start having screenings again. Some women have medical problems that increase the chance of getting cervical cancer. In these cases, your health care provider may recommend that you have screening and Pap tests more often.  If you have a family history of uterine cancer or ovarian cancer, talk with your health care provider about genetic screening.  If you have vaginal bleeding after reaching menopause, tell your health care provider.  There are currently no reliable tests available to screen for ovarian cancer.  Lung Cancer Lung cancer screening is recommended for adults 69-62 years old who are at high risk for lung cancer because of a history of smoking. A yearly low-dose CT scan of the lungs is recommended if you:  Currently smoke.  Have a history of at least 30 pack-years of smoking and you currently smoke or have quit within the past 15 years. A pack-year is smoking an average of one pack of cigarettes per day for one year.  Yearly screening should:  Continue until it has been 15 years since you quit.  Stop if you develop a health problem that would prevent you from having lung cancer treatment.  Colorectal Cancer  This type of cancer can be detected and can often be prevented.  Routine colorectal cancer screening usually begins at  age 42 and continues through age 45.  If you have risk factors for colon cancer, your health care provider may recommend that you be screened at an earlier age.  If you have a family history of colorectal cancer, talk with your health care provider about genetic screening.  Your health care provider may also recommend using home test kits to check for hidden blood in your stool.  A small camera at the end of a tube can be used to examine your colon directly (sigmoidoscopy or colonoscopy). This is done to check for the earliest forms of colorectal cancer.  Direct examination of the colon should be repeated every  5-10 years until age 71. However, if early forms of precancerous polyps or small growths are found or if you have a family history or genetic risk for colorectal cancer, you may need to be screened more often.  Skin Cancer  Check your skin from head to toe regularly.  Monitor any moles. Be sure to tell your health care provider: ? About any new moles or changes in moles, especially if there is a change in a mole's shape or color. ? If you have a mole that is larger than the size of a pencil eraser.  If any of your family members has a history of skin cancer, especially at a Charlene Schroeder age, talk with your health care provider about genetic screening.  Always use sunscreen. Apply sunscreen liberally and repeatedly throughout the day.  Whenever you are outside, protect yourself by wearing long sleeves, pants, a wide-brimmed hat, and sunglasses.  What should I know about osteoporosis? Osteoporosis is a condition in which bone destruction happens more quickly than new bone creation. After menopause, you may be at an increased risk for osteoporosis. To help prevent osteoporosis or the bone fractures that can happen because of osteoporosis, the following is recommended:  If you are 46-71 years old, get at least 1,000 mg of calcium and at least 600 mg of vitamin D per day.  If you are older than age 55 but younger than age 65, get at least 1,200 mg of calcium and at least 600 mg of vitamin D per day.  If you are older than age 54, get at least 1,200 mg of calcium and at least 800 mg of vitamin D per day.  Smoking and excessive alcohol intake increase the risk of osteoporosis. Eat foods that are rich in calcium and vitamin D, and do weight-bearing exercises several times each week as directed by your health care provider. What should I know about how menopause affects my mental health? Depression may occur at any age, but it is more common as you become older. Common symptoms of depression  include:  Low or sad mood.  Changes in sleep patterns.  Changes in appetite or eating patterns.  Feeling an overall lack of motivation or enjoyment of activities that you previously enjoyed.  Frequent crying spells.  Talk with your health care provider if you think that you are experiencing depression. What should I know about immunizations? It is important that you get and maintain your immunizations. These include:  Tetanus, diphtheria, and pertussis (Tdap) booster vaccine.  Influenza every year before the flu season begins.  Pneumonia vaccine.  Shingles vaccine.  Your health care provider may also recommend other immunizations. This information is not intended to replace advice given to you by your health care provider. Make sure you discuss any questions you have with your health care provider. Document Released: 10/01/2005  Document Revised: 02/27/2016 Document Reviewed: 05/13/2015 Elsevier Interactive Patient Education  2018 Elsevier Inc.  

## 2018-02-27 NOTE — Progress Notes (Signed)
[place] [birth year] [id number]    History:    Presents for breast and pelvic exam.  Postmenopausal on no HRT with no bleeding.  2017 left humerus fracture.  Normal Pap and mammogram history.  Sexually active.  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  Has a strained relationship with her son who lives local with his wife and granddaughters.  Other son lives in [place] good relationship.  Primary care manages hypertension , Sjogren's.  Current on vaccines and colonoscopy.  Past medical history, past surgical history, family history and social history were all reviewed and documented in the EPIC chart.  Son at [facility] expecting first baby  boy in several months.  Husband having several health problems.  ROS:  A ROS was performed and pertinent positives and negatives are included.  Exam:  Vitals:   02/27/18 1428  BP: 126/78  Weight: 107 lb (48.5 kg)  Height: 5\' 3"  (1.6 m)   Body mass index is 18.95 kg/m.   General appearance:  Normal Thyroid:  Symmetrical, normal in size, without palpable masses or nodularity. Respiratory  Auscultation:  Clear without wheezing or rhonchi Cardiovascular  Auscultation:  Regular rate, without rubs, murmurs or gallops  Edema/varicosities:  Not grossly evident Abdominal  Soft,nontender, without masses, guarding or rebound.  Liver/spleen:  No organomegaly noted  Hernia:  None appreciated  Skin  Inspection:  Grossly normal   Breasts: Examined lying and sitting. Bilateral implants    Right: Without masses, retractions, discharge or axillary adenopathy.     Left: Without masses, retractions, discharge or axillary adenopathy. Gentitourinary   Inguinal/mons:  Normal without inguinal adenopathy  External genitalia:  Normal  BUS/Urethra/Skene's glands:  Normal  Vagina: Atrophic  Cervix:  Normal  Uterus:  normal in size, shape and contour.  Midline and mobile  Adnexa/parametria:     Rt: Without masses or tenderness.   Lt: Without  masses or tenderness.  Anus and perineum: Normal  Digital rectal exam: Normal sphincter tone without palpated masses or tenderness  Assessment/Plan:  77 y.o. MWF G2, P2 for breast and pelvic exam.  Postmenopausal on no HRT with no bleeding Lymphoma-oncologist manages MI-cardiologist managing Sjogren's-primary care managing labs   Plan: SBE's, continue annual screening mammogram, exercise as able, encouraged to try to increase high calorie foods, frequent meals, continue iron supplement and iron rich foods.  Home safety, fall prevention and importance of balance type exercise such as yoga encouraged.  Pap screening guidelines reviewed.  Any vaginal lubricants as needed.    Huel Cote [facility], 6:47 PM 02/27/2018

## 2018-02-28 ENCOUNTER — Other Ambulatory Visit: Payer: Self-pay

## 2018-02-28 ENCOUNTER — Telehealth: Payer: Self-pay

## 2018-02-28 ENCOUNTER — Other Ambulatory Visit: Payer: Self-pay | Admitting: Women's Health

## 2018-02-28 MED ORDER — NITROFURANTOIN MONOHYD MACRO 100 MG PO CAPS: ORAL_CAPSULE | ORAL | 0 refills | Status: AC

## 2018-02-28 NOTE — Telephone Encounter (Signed)
ok 

## 2018-02-28 NOTE — Telephone Encounter (Signed)
No, it says lower risk with 3 day but will do macrobid and take with food BID for 7 days, rx sent. Please notify pt

## 2018-02-28 NOTE — Telephone Encounter (Signed)
-----   Message from Huel Cote, NP sent at 02/28/2018  7:29 AM EDT ----- Please call and review urine pos for infection, septra bid for 3 days and then TOC UA in 2 weeks.  She was asymptomatic at OV. Ask if any symptoms today?

## 2018-02-28 NOTE — Telephone Encounter (Signed)
Charlene Schroeder, patient is still asymptomatic.  Please note that there is a Drug-Drug affect warning in the system when I tried to prescribed Septra. Between her Methotrexate and Septra. Still want to prescribe?

## 2018-02-28 NOTE — Telephone Encounter (Signed)
Patient informed of change in Rx. SHe said after we talked she remembered her oncologist told her if ever she gets infection to call him before she takes anything. She said she is seeing him in the morning. Since she is asymptomatic she is going to wait until she talks with him tomorrow before starting this.

## 2018-02-28 NOTE — Telephone Encounter (Signed)
Will you telephone encounter off of result note.

## 2018-03-01 LAB — URINALYSIS, COMPLETE W/RFL CULTURE
Bilirubin Urine: NEGATIVE
GLUCOSE, UA: NEGATIVE
HYALINE CAST: NONE SEEN /LPF
KETONES UR: NEGATIVE
Nitrites, Initial: POSITIVE — AB
PH: 6.5 (ref 5.0–8.0)
Protein, ur: NEGATIVE
Specific Gravity, Urine: 1.01 (ref 1.001–1.03)
Squamous Epithelial / LPF: NONE SEEN /HPF (ref <=5)
WBC, UA: >=60 /HPF — AB (ref 0–5)

## 2018-03-01 LAB — URINE CULTURE
MICRO NUMBER: 90810646
SPECIMEN QUALITY:: ADEQUATE

## 2018-03-01 LAB — CULTURE INDICATED

## 2018-03-14 ENCOUNTER — Ambulatory Visit: Payer: Medicare Other | Admitting: Licensed Clinical Social Worker

## 2018-03-14 ENCOUNTER — Ambulatory Visit: Payer: Self-pay

## 2018-04-03 ENCOUNTER — Ambulatory Visit: Payer: Self-pay

## 2018-04-03 ENCOUNTER — Other Ambulatory Visit: Payer: Self-pay | Admitting: Women's Health

## 2018-04-20 ENCOUNTER — Ambulatory Visit (INDEPENDENT_AMBULATORY_CARE_PROVIDER_SITE_OTHER): Payer: Medicare Other | Admitting: Licensed Clinical Social Worker

## 2018-04-20 DIAGNOSIS — F3341 Major depressive disorder, recurrent, in partial remission: Secondary | ICD-10-CM

## 2018-05-04 ENCOUNTER — Ambulatory Visit
Admission: RE | Admit: 2018-05-04 | Discharge: 2018-05-04 | Disposition: A | Discharge status: Home / Self Care | Payer: Medicare Other | Source: Ambulatory Visit | Attending: Women's Health | Admitting: Women's Health

## 2018-05-04 DIAGNOSIS — Z1231 Encounter for screening mammogram for malignant neoplasm of breast: Secondary | ICD-10-CM

## 2018-05-18 ENCOUNTER — Ambulatory Visit: Payer: Medicare Other | Admitting: Licensed Clinical Social Worker

## 2018-07-11 ENCOUNTER — Ambulatory Visit (INDEPENDENT_AMBULATORY_CARE_PROVIDER_SITE_OTHER): Payer: Medicare Other | Admitting: Licensed Clinical Social Worker

## 2018-07-11 DIAGNOSIS — F3341 Major depressive disorder, recurrent, in partial remission: Secondary | ICD-10-CM | POA: Diagnosis not present

## 2019-04-15 IMAGING — MG DIGITAL SCREENING BILATERAL MAMMOGRAM WITH IMPLANTS, CAD AND TOM
9 of 12 series · 9 of 28 positions shown · non-contrast
Comparison: Previous exam(s).

CLINICAL DATA: Screening.

EXAM:
DIGITAL SCREENING BILATERAL MAMMOGRAM WITH IMPLANTS, CAD AND TOMO
The patient has retropectoral implants. Standard and implant
displaced views were performed.

[L CC]
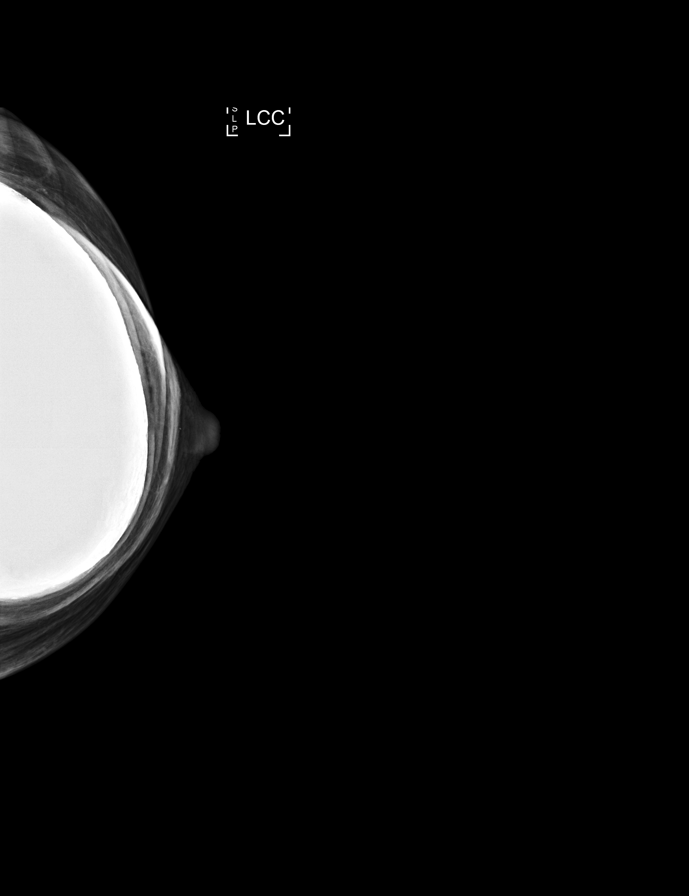

[L MLO]
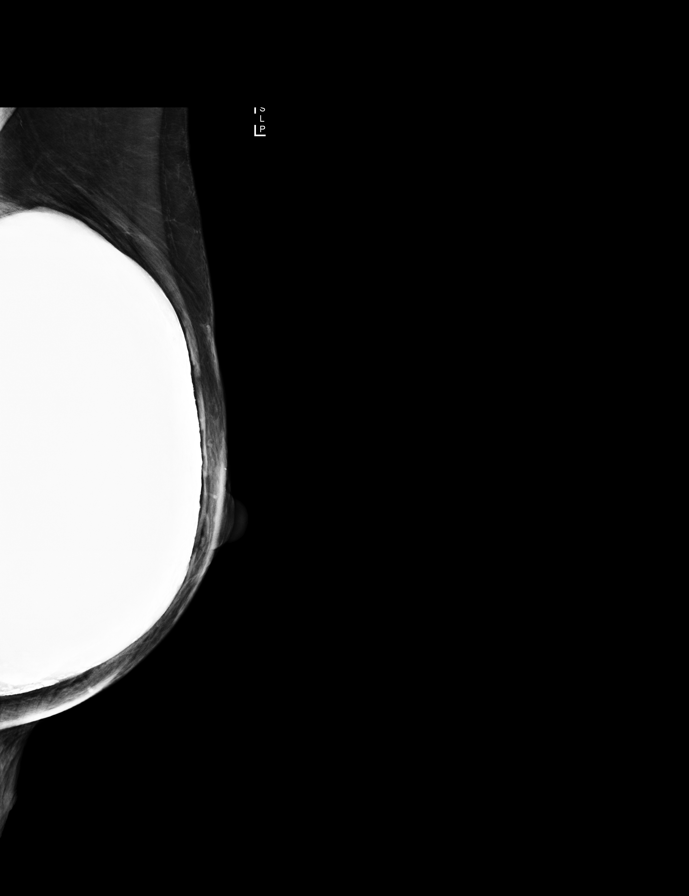

[R MLO]
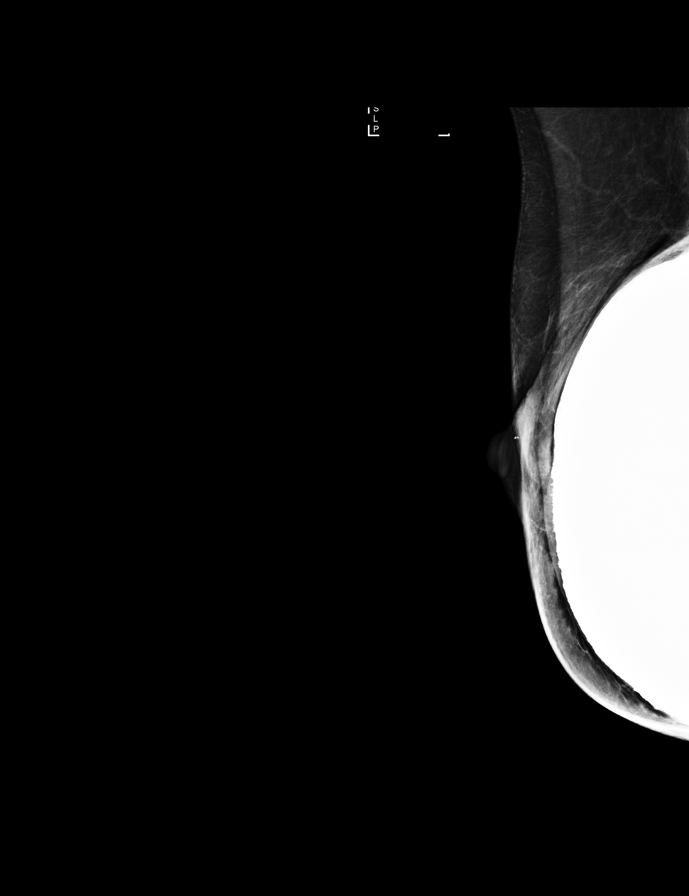

[R CC]
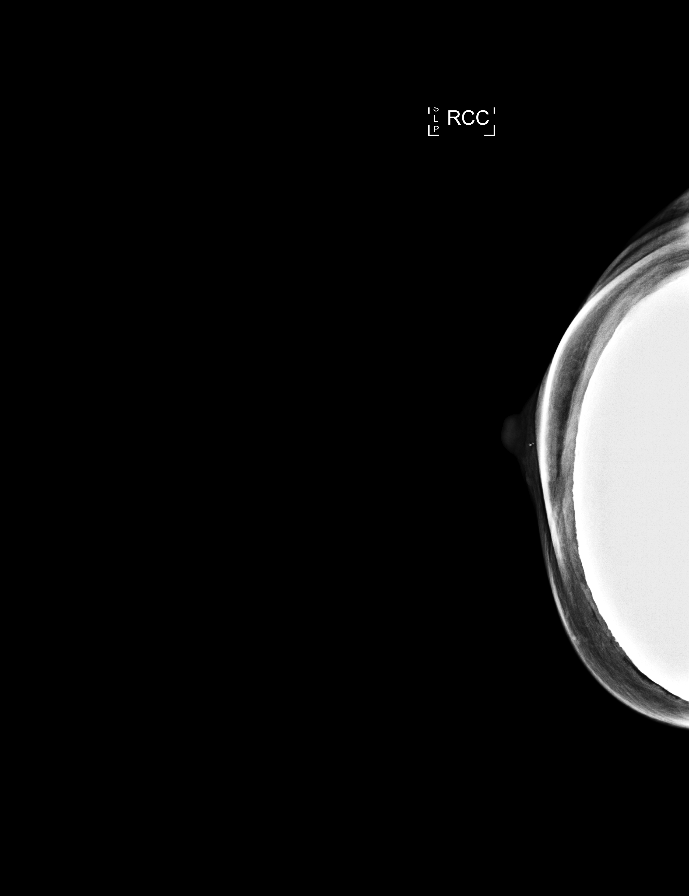

[R CC synth-2D]
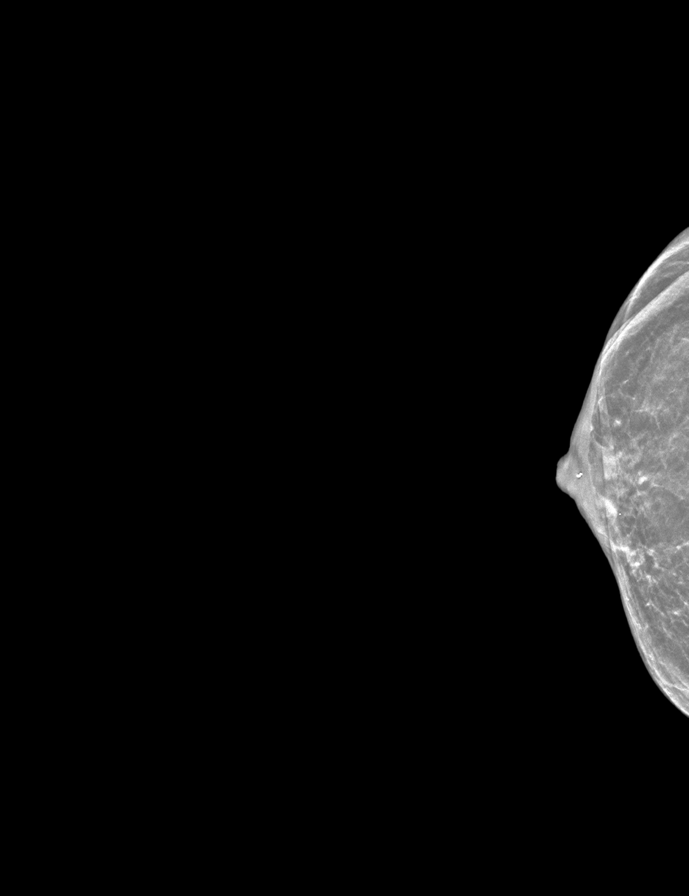

[L CC synth-2D]
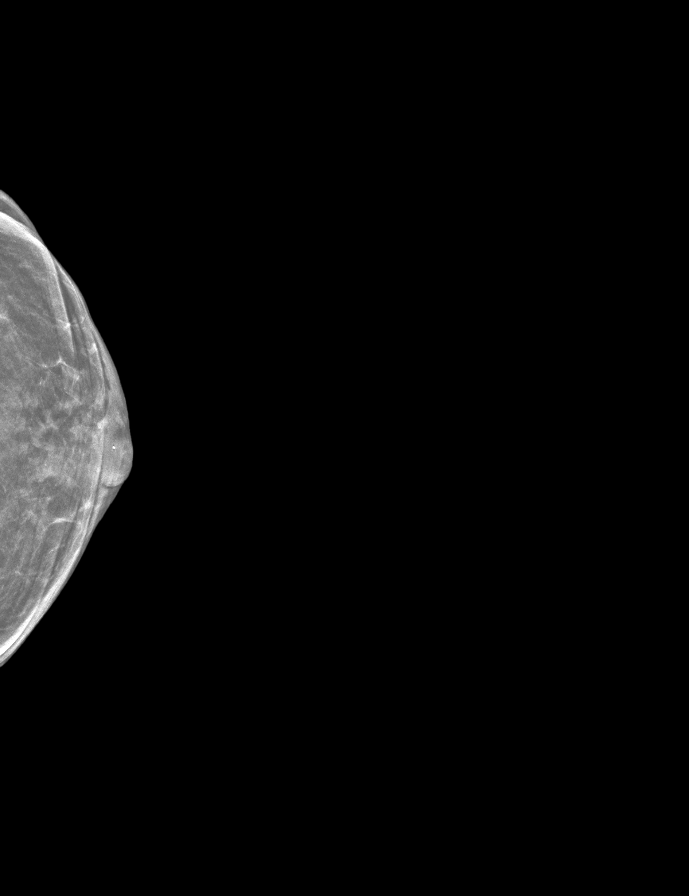

[L MLO synth-2D]
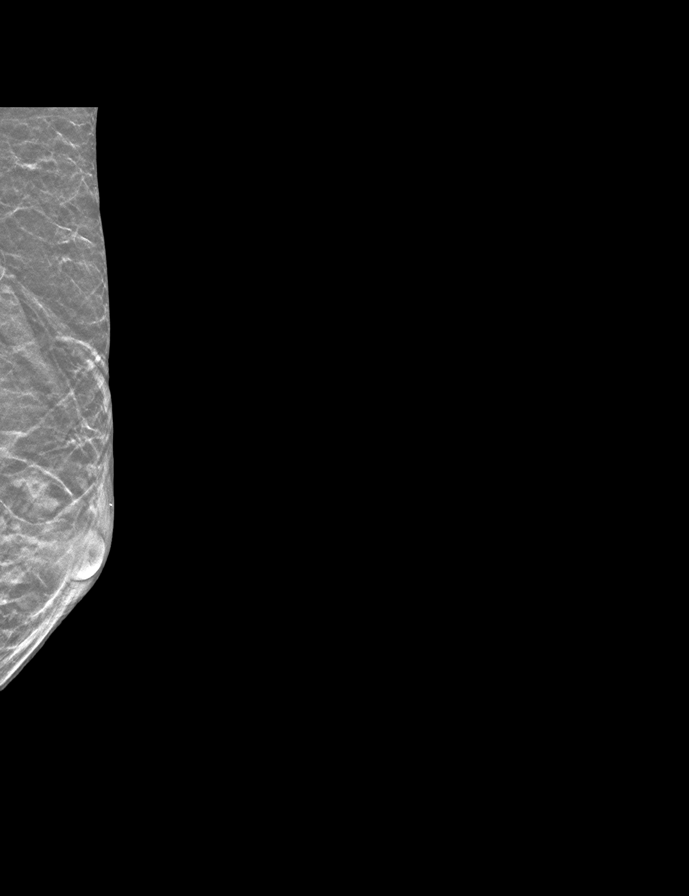

[R MLO synth-2D]
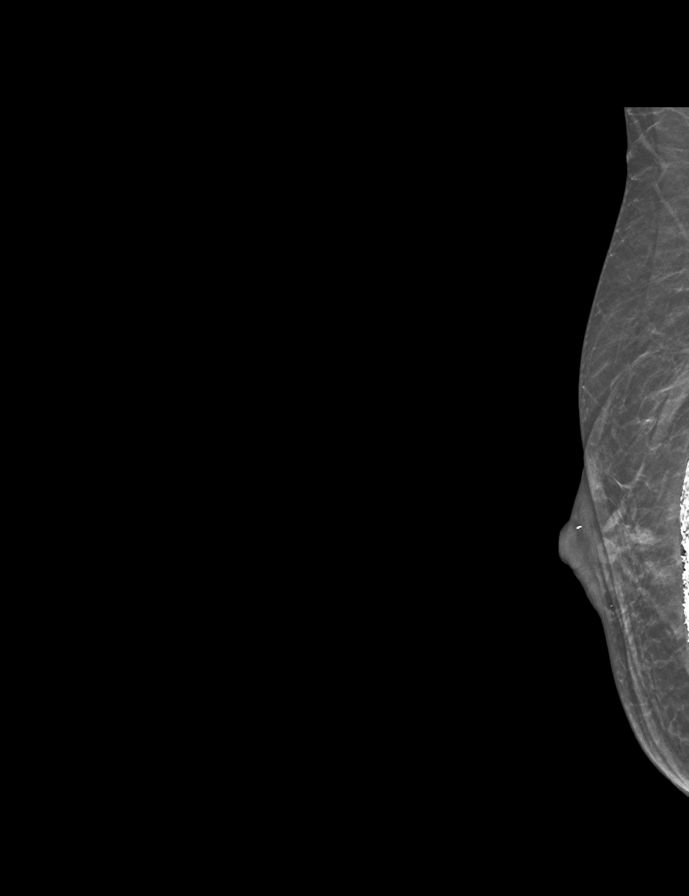

[L MLOID BREAST TOMOSYNTHESIS IMAGE tomo · tomo slice 11/20.0]
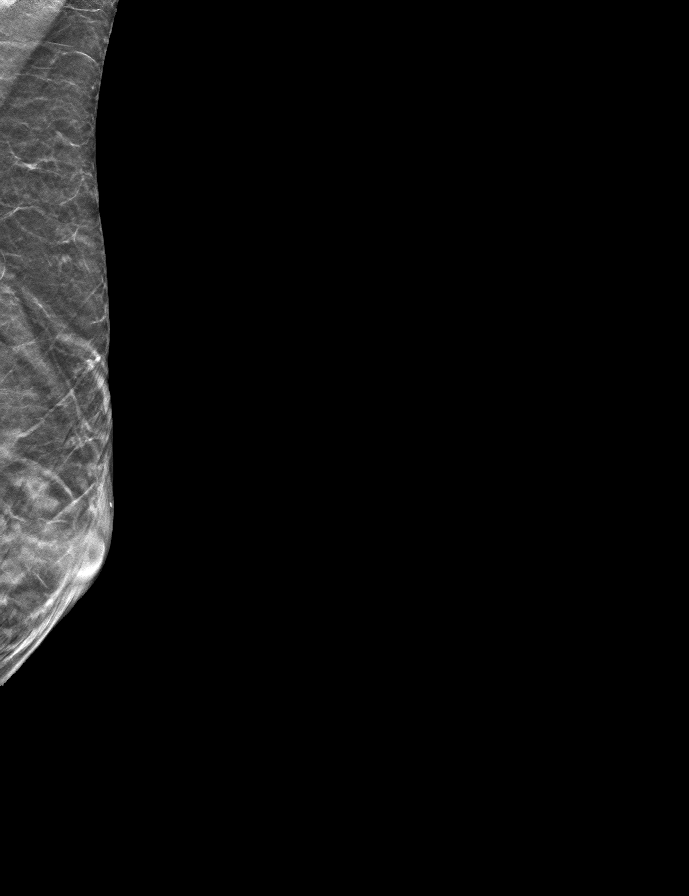

[9 of 28 positions shown; findings below may reference images not displayed]

ACR Breast Density Category b: There are scattered areas of
fibroglandular density.
FINDINGS: There are no findings suspicious for malignancy. Images were
processed with CAD.
IMPRESSION: No mammographic evidence of malignancy. A result letter of this
screening mammogram will be mailed directly to the patient.

RECOMMENDATION:
Screening mammogram in one year. (Code:[id number])

BI-RADS CATEGORY  1:  Negative.

## 2019-04-21 ENCOUNTER — Other Ambulatory Visit: Payer: Self-pay | Admitting: Women's Health

## 2019-05-21 ENCOUNTER — Other Ambulatory Visit: Payer: Self-pay

## 2019-05-23 ENCOUNTER — Encounter: Payer: Self-pay | Admitting: Women's Health

## 2019-05-23 ENCOUNTER — Other Ambulatory Visit: Payer: Self-pay

## 2019-05-23 ENCOUNTER — Ambulatory Visit (INDEPENDENT_AMBULATORY_CARE_PROVIDER_SITE_OTHER): Payer: Medicare Other | Admitting: Women's Health

## 2019-05-23 VITALS — BP 122/79

## 2019-05-23 DIAGNOSIS — R52 Pain, unspecified: Secondary | ICD-10-CM

## 2019-05-23 DIAGNOSIS — N644 Mastodynia: Secondary | ICD-10-CM | POA: Diagnosis not present

## 2019-05-23 NOTE — Patient Instructions (Signed)
Health Maintenance After Age 76 After age 76, you are at a higher risk for certain long-term diseases and infections as well as injuries from falls. Falls are a major cause of broken bones and head injuries in people who are older than age 76. Getting regular preventive care can help to keep you healthy and well. Preventive care includes getting regular testing and making lifestyle changes as recommended by your health care provider. Talk with your health care provider about:  Which screenings and tests you should have. A screening is a test that checks for a disease when you have no symptoms.  A diet and exercise plan that is right for you. What should I know about screenings and tests to prevent falls? Screening and testing are the best ways to find a health problem early. Early diagnosis and treatment give you the best chance of managing medical conditions that are common after age 76. Certain conditions and lifestyle choices may make you more likely to have a fall. Your health care provider may recommend:  Regular vision checks. Poor vision and conditions such as cataracts can make you more likely to have a fall. If you wear glasses, make sure to get your prescription updated if your vision changes.  Medicine review. Work with your health care provider to regularly review all of the medicines you are taking, including over-the-counter medicines. Ask your health care provider about any side effects that may make you more likely to have a fall. Tell your health care provider if any medicines that you take make you feel dizzy or sleepy.  Osteoporosis screening. Osteoporosis is a condition that causes the bones to get weaker. This can make the bones weak and cause them to break more easily.  Blood pressure screening. Blood pressure changes and medicines to control blood pressure can make you feel dizzy.  Strength and balance checks. Your health care provider may recommend certain tests to check your  strength and balance while standing, walking, or changing positions.  Foot health exam. Foot pain and numbness, as well as not wearing proper footwear, can make you more likely to have a fall.  Depression screening. You may be more likely to have a fall if you have a fear of falling, feel emotionally low, or feel unable to do activities that you used to do.  Alcohol use screening. Using too much alcohol can affect your balance and may make you more likely to have a fall. What actions can I take to lower my risk of falls? General instructions  Talk with your health care provider about your risks for falling. Tell your health care provider if: ? You fall. Be sure to tell your health care provider about all falls, even ones that seem minor. ? You feel dizzy, sleepy, or off-balance.  Take over-the-counter and prescription medicines only as told by your health care provider. These include any supplements.  Eat a healthy diet and maintain a healthy weight. A healthy diet includes low-fat dairy products, low-fat (lean) meats, and fiber from whole grains, beans, and lots of fruits and vegetables. Home safety  Remove any tripping hazards, such as rugs, cords, and clutter.  Install safety equipment such as grab bars in bathrooms and safety rails on stairs.  Keep rooms and walkways well-lit. Activity   Follow a regular exercise program to stay fit. This will help you maintain your balance. Ask your health care provider what types of exercise are appropriate for you.  If you need a cane or   walker, use it as recommended by your health care provider.  Wear supportive shoes that have nonskid soles. Lifestyle  Do not drink alcohol if your health care provider tells you not to drink.  If you drink alcohol, limit how much you have: ? 0-1 drink a day for women. ? 0-2 drinks a day for men.  Be aware of how much alcohol is in your drink. In the [place] one drink equals one typical bottle of beer (12  oz), one-half glass of wine (5 oz), or one shot of hard liquor (1 oz).  Do not use any products that contain nicotine or tobacco, such as cigarettes and e-cigarettes. If you need help quitting, ask your health care provider. Summary  Having a healthy lifestyle and getting preventive care can help to protect your health and wellness after age 76.  Screening and testing are the best way to find a health problem early and help you avoid having a fall. Early diagnosis and treatment give you the best chance for managing medical conditions that are more common for people who are older than age 76.  Falls are a major cause of broken bones and head injuries in people who are older than age 76. Take precautions to prevent a fall at home.  Work with your health care provider to learn what changes you can make to improve your health and wellness and to prevent falls. This information is not intended to replace advice given to you by your health care provider. Make sure you discuss any questions you have with your health care provider. Document Released: 06/22/2017 Document Revised: 11/30/2018 Document Reviewed: 06/22/2017 Elsevier Patient Education  2020 Elsevier Inc.  

## 2019-05-23 NOTE — Progress Notes (Signed)
78 year old MWF G2, P2 presents with complaint of right breast pain from fall 3 days ago.  Did not seek treatment after fall.  States has been very weak, unsteady on her feet, using a wheelchair.  Has skin infection on left cheek that is being treated and reports is getting better.  Has a port on the left lateral lung. Hospitalized this month in [facility] and was told no reason for her weakness and inability to walk.  Medical problems include lymphoma, Sjogren's, heart disease.  Exam: Appears weak, color poor, not herself.  Had to assist her onto the table was able to stand.  Lungs were clear, tenderness in the right lateral rib area.  Able to take a deep breath but was short of breath when talking.  Breast examined in sitting and lying position without palpable nodules, implants intact, bruising to the right lateral breast, no nipple discharge, retractions or dimpling noted.  Right arm edematous into  shoulder area states has been that way for greater than a month, not from injury.  Both ankles pitting edema.  Right lateral chest pain from fall Declining health  Plan: Reviewed plan with both husband and Claudine, instructed them to follow-up with primary care for possible chest x-ray.  Reviewed breast exam normal,  discomfort is in the rib area not in the breast.  Normal mammogram 04/2018, at this time would have much difficulty tolerating mammogram.

## 2019-08-24 DEATH — deceased
